# Patient Record
Sex: Male | Born: 1960
Health system: Southern US, Community
[De-identification: ages and names within clinical notes are randomized; demographics above are authoritative.]

## PROBLEM LIST (undated history)

## (undated) DIAGNOSIS — J449 Chronic obstructive pulmonary disease, unspecified: Secondary | ICD-10-CM

## (undated) DIAGNOSIS — G629 Polyneuropathy, unspecified: Secondary | ICD-10-CM

## (undated) DIAGNOSIS — I2510 Atherosclerotic heart disease of native coronary artery without angina pectoris: Secondary | ICD-10-CM

## (undated) DIAGNOSIS — F419 Anxiety disorder, unspecified: Secondary | ICD-10-CM

## (undated) DIAGNOSIS — J189 Pneumonia, unspecified organism: Secondary | ICD-10-CM

## (undated) DIAGNOSIS — K5900 Constipation, unspecified: Secondary | ICD-10-CM

## (undated) DIAGNOSIS — R0600 Dyspnea, unspecified: Secondary | ICD-10-CM

## (undated) DIAGNOSIS — E119 Type 2 diabetes mellitus without complications: Secondary | ICD-10-CM

## (undated) HISTORY — PX: VASECTOMY: SHX75

## (undated) HISTORY — PX: KNEE ARTHROSCOPY: SUR90

## (undated) HISTORY — PX: CIRCUMCISION: SUR203

## (undated) HISTORY — PX: COLONOSCOPY W/ POLYPECTOMY: SHX1380

---

## 2005-01-04 ENCOUNTER — Ambulatory Visit: Payer: Self-pay | Admitting: Family Medicine

## 2005-01-07 ENCOUNTER — Ambulatory Visit: Payer: Self-pay | Admitting: Family Medicine

## 2005-02-01 ENCOUNTER — Ambulatory Visit: Payer: Self-pay | Admitting: Family Medicine

## 2005-04-08 ENCOUNTER — Ambulatory Visit: Payer: Self-pay | Admitting: Family Medicine

## 2012-01-20 ENCOUNTER — Other Ambulatory Visit: Payer: Self-pay | Admitting: Neurosurgery

## 2012-01-20 DIAGNOSIS — M542 Cervicalgia: Secondary | ICD-10-CM

## 2012-01-27 ENCOUNTER — Ambulatory Visit
Admission: RE | Admit: 2012-01-27 | Discharge: 2012-01-27 | Disposition: A | Discharge status: Home / Self Care | Payer: BC Managed Care – PPO | Source: Ambulatory Visit | Attending: Neurosurgery | Admitting: Neurosurgery

## 2012-01-27 VITALS — BP 133/72 | HR 63 | Ht 74.0 in | Wt 261.0 lb

## 2012-01-27 DIAGNOSIS — M542 Cervicalgia: Secondary | ICD-10-CM

## 2012-01-27 MED ORDER — ONDANSETRON HCL 4 MG/2ML IJ SOLN
4.0000 mg | Freq: Once | INTRAMUSCULAR | Status: AC
  Administered 2012-01-27: 4 mg via INTRAMUSCULAR

## 2012-01-27 MED ORDER — IOHEXOL 300 MG/ML  SOLN
10.0000 mL | Freq: Once | INTRAMUSCULAR | Status: AC | PRN
  Administered 2012-01-27: 10 mL via INTRATHECAL

## 2012-01-27 MED ORDER — MEPERIDINE HCL 100 MG/ML IJ SOLN
100.0000 mg | Freq: Once | INTRAMUSCULAR | Status: AC
  Administered 2012-01-27: 100 mg via INTRAMUSCULAR

## 2012-01-27 MED ORDER — DIAZEPAM 5 MG PO TABS
10.0000 mg | ORAL_TABLET | Freq: Once | ORAL | Status: AC
  Administered 2012-01-27: 10 mg via ORAL

## 2012-01-27 MED ORDER — HYDROCODONE-ACETAMINOPHEN 5-325 MG PO TABS
1.0000 | ORAL_TABLET | Freq: Once | ORAL | Status: AC
  Administered 2012-01-27: 1 via ORAL

## 2014-04-18 ENCOUNTER — Other Ambulatory Visit: Payer: Self-pay | Admitting: Neurosurgery

## 2014-04-18 DIAGNOSIS — M47812 Spondylosis without myelopathy or radiculopathy, cervical region: Secondary | ICD-10-CM

## 2014-04-18 DIAGNOSIS — M545 Low back pain: Secondary | ICD-10-CM

## 2014-05-06 ENCOUNTER — Ambulatory Visit
Admission: RE | Admit: 2014-05-06 | Discharge: 2014-05-06 | Disposition: A | Discharge status: Home / Self Care | Payer: BC Managed Care – PPO | Source: Ambulatory Visit | Attending: Neurosurgery | Admitting: Neurosurgery

## 2014-05-06 DIAGNOSIS — M47812 Spondylosis without myelopathy or radiculopathy, cervical region: Secondary | ICD-10-CM

## 2014-05-06 DIAGNOSIS — M545 Low back pain: Secondary | ICD-10-CM

## 2014-07-16 ENCOUNTER — Other Ambulatory Visit: Payer: Self-pay | Admitting: Neurosurgery

## 2014-07-16 DIAGNOSIS — M4722 Other spondylosis with radiculopathy, cervical region: Secondary | ICD-10-CM

## 2014-07-16 DIAGNOSIS — M4712 Other spondylosis with myelopathy, cervical region: Secondary | ICD-10-CM

## 2014-07-16 DIAGNOSIS — G8929 Other chronic pain: Secondary | ICD-10-CM

## 2014-07-16 DIAGNOSIS — M545 Low back pain: Principal | ICD-10-CM

## 2015-09-15 HISTORY — PX: CARDIAC CATHETERIZATION: SHX172

## 2015-09-24 DIAGNOSIS — I249 Acute ischemic heart disease, unspecified: Secondary | ICD-10-CM | POA: Diagnosis not present

## 2015-09-24 DIAGNOSIS — I10 Essential (primary) hypertension: Secondary | ICD-10-CM | POA: Diagnosis not present

## 2015-09-24 DIAGNOSIS — G4733 Obstructive sleep apnea (adult) (pediatric): Secondary | ICD-10-CM

## 2015-09-24 DIAGNOSIS — E669 Obesity, unspecified: Secondary | ICD-10-CM | POA: Diagnosis not present

## 2015-09-24 DIAGNOSIS — E785 Hyperlipidemia, unspecified: Secondary | ICD-10-CM | POA: Diagnosis not present

## 2015-09-24 DIAGNOSIS — J449 Chronic obstructive pulmonary disease, unspecified: Secondary | ICD-10-CM

## 2015-09-25 DIAGNOSIS — E669 Obesity, unspecified: Secondary | ICD-10-CM | POA: Diagnosis not present

## 2015-09-25 DIAGNOSIS — E785 Hyperlipidemia, unspecified: Secondary | ICD-10-CM | POA: Diagnosis not present

## 2015-09-25 DIAGNOSIS — I10 Essential (primary) hypertension: Secondary | ICD-10-CM | POA: Diagnosis not present

## 2015-09-25 DIAGNOSIS — I249 Acute ischemic heart disease, unspecified: Secondary | ICD-10-CM | POA: Diagnosis not present

## 2015-09-26 DIAGNOSIS — I209 Angina pectoris, unspecified: Secondary | ICD-10-CM | POA: Insufficient documentation

## 2015-10-14 DIAGNOSIS — F17200 Nicotine dependence, unspecified, uncomplicated: Secondary | ICD-10-CM | POA: Insufficient documentation

## 2015-10-14 DIAGNOSIS — I10 Essential (primary) hypertension: Secondary | ICD-10-CM | POA: Insufficient documentation

## 2015-10-14 DIAGNOSIS — E785 Hyperlipidemia, unspecified: Secondary | ICD-10-CM | POA: Insufficient documentation

## 2015-10-14 DIAGNOSIS — I2510 Atherosclerotic heart disease of native coronary artery without angina pectoris: Secondary | ICD-10-CM | POA: Insufficient documentation

## 2016-07-28 DIAGNOSIS — E1165 Type 2 diabetes mellitus with hyperglycemia: Secondary | ICD-10-CM

## 2016-07-28 DIAGNOSIS — R509 Fever, unspecified: Secondary | ICD-10-CM | POA: Diagnosis not present

## 2016-07-28 DIAGNOSIS — N12 Tubulo-interstitial nephritis, not specified as acute or chronic: Secondary | ICD-10-CM

## 2016-07-28 DIAGNOSIS — B9620 Unspecified Escherichia coli [E. coli] as the cause of diseases classified elsewhere: Secondary | ICD-10-CM

## 2016-07-28 DIAGNOSIS — I2510 Atherosclerotic heart disease of native coronary artery without angina pectoris: Secondary | ICD-10-CM | POA: Diagnosis not present

## 2016-07-29 DIAGNOSIS — R509 Fever, unspecified: Secondary | ICD-10-CM | POA: Diagnosis not present

## 2016-07-29 DIAGNOSIS — N12 Tubulo-interstitial nephritis, not specified as acute or chronic: Secondary | ICD-10-CM | POA: Diagnosis not present

## 2016-07-29 DIAGNOSIS — E1165 Type 2 diabetes mellitus with hyperglycemia: Secondary | ICD-10-CM | POA: Diagnosis not present

## 2016-07-29 DIAGNOSIS — B9620 Unspecified Escherichia coli [E. coli] as the cause of diseases classified elsewhere: Secondary | ICD-10-CM | POA: Diagnosis not present

## 2016-07-31 DIAGNOSIS — B9620 Unspecified Escherichia coli [E. coli] as the cause of diseases classified elsewhere: Secondary | ICD-10-CM | POA: Diagnosis not present

## 2016-07-31 DIAGNOSIS — E1165 Type 2 diabetes mellitus with hyperglycemia: Secondary | ICD-10-CM | POA: Diagnosis not present

## 2016-07-31 DIAGNOSIS — N12 Tubulo-interstitial nephritis, not specified as acute or chronic: Secondary | ICD-10-CM | POA: Diagnosis not present

## 2016-07-31 DIAGNOSIS — R509 Fever, unspecified: Secondary | ICD-10-CM | POA: Diagnosis not present

## 2017-05-31 DIAGNOSIS — D510 Vitamin B12 deficiency anemia due to intrinsic factor deficiency: Secondary | ICD-10-CM | POA: Diagnosis not present

## 2017-06-13 DIAGNOSIS — M48061 Spinal stenosis, lumbar region without neurogenic claudication: Secondary | ICD-10-CM | POA: Diagnosis not present

## 2017-06-28 DIAGNOSIS — D510 Vitamin B12 deficiency anemia due to intrinsic factor deficiency: Secondary | ICD-10-CM | POA: Diagnosis not present

## 2017-06-29 DIAGNOSIS — Z6835 Body mass index (BMI) 35.0-35.9, adult: Secondary | ICD-10-CM | POA: Diagnosis not present

## 2017-06-29 DIAGNOSIS — J329 Chronic sinusitis, unspecified: Secondary | ICD-10-CM | POA: Diagnosis not present

## 2017-06-29 DIAGNOSIS — J302 Other seasonal allergic rhinitis: Secondary | ICD-10-CM | POA: Diagnosis not present

## 2017-06-29 DIAGNOSIS — J40 Bronchitis, not specified as acute or chronic: Secondary | ICD-10-CM | POA: Diagnosis not present

## 2017-07-20 DIAGNOSIS — M48061 Spinal stenosis, lumbar region without neurogenic claudication: Secondary | ICD-10-CM | POA: Diagnosis not present

## 2017-07-20 DIAGNOSIS — M4802 Spinal stenosis, cervical region: Secondary | ICD-10-CM | POA: Diagnosis not present

## 2017-07-20 DIAGNOSIS — M545 Low back pain: Secondary | ICD-10-CM | POA: Diagnosis not present

## 2017-08-24 DIAGNOSIS — D510 Vitamin B12 deficiency anemia due to intrinsic factor deficiency: Secondary | ICD-10-CM | POA: Diagnosis not present

## 2017-08-28 DIAGNOSIS — M5137 Other intervertebral disc degeneration, lumbosacral region: Secondary | ICD-10-CM | POA: Diagnosis not present

## 2017-08-28 DIAGNOSIS — E1165 Type 2 diabetes mellitus with hyperglycemia: Secondary | ICD-10-CM | POA: Diagnosis not present

## 2017-08-28 DIAGNOSIS — G894 Chronic pain syndrome: Secondary | ICD-10-CM | POA: Diagnosis not present

## 2017-08-28 DIAGNOSIS — M170 Bilateral primary osteoarthritis of knee: Secondary | ICD-10-CM | POA: Diagnosis not present

## 2017-09-13 DIAGNOSIS — G4733 Obstructive sleep apnea (adult) (pediatric): Secondary | ICD-10-CM | POA: Diagnosis not present

## 2017-09-13 DIAGNOSIS — J45901 Unspecified asthma with (acute) exacerbation: Secondary | ICD-10-CM | POA: Diagnosis not present

## 2017-10-05 DIAGNOSIS — Z87891 Personal history of nicotine dependence: Secondary | ICD-10-CM | POA: Diagnosis not present

## 2017-10-17 DIAGNOSIS — I2510 Atherosclerotic heart disease of native coronary artery without angina pectoris: Secondary | ICD-10-CM | POA: Diagnosis not present

## 2017-10-17 DIAGNOSIS — I10 Essential (primary) hypertension: Secondary | ICD-10-CM | POA: Diagnosis not present

## 2017-10-17 DIAGNOSIS — F17200 Nicotine dependence, unspecified, uncomplicated: Secondary | ICD-10-CM | POA: Diagnosis not present

## 2017-10-17 DIAGNOSIS — Z955 Presence of coronary angioplasty implant and graft: Secondary | ICD-10-CM | POA: Diagnosis not present

## 2017-10-17 DIAGNOSIS — E119 Type 2 diabetes mellitus without complications: Secondary | ICD-10-CM | POA: Diagnosis not present

## 2017-10-19 DIAGNOSIS — M48061 Spinal stenosis, lumbar region without neurogenic claudication: Secondary | ICD-10-CM | POA: Diagnosis not present

## 2017-10-19 DIAGNOSIS — M4802 Spinal stenosis, cervical region: Secondary | ICD-10-CM | POA: Diagnosis not present

## 2017-10-19 DIAGNOSIS — I10 Essential (primary) hypertension: Secondary | ICD-10-CM | POA: Diagnosis not present

## 2017-10-19 DIAGNOSIS — Z6837 Body mass index (BMI) 37.0-37.9, adult: Secondary | ICD-10-CM | POA: Diagnosis not present

## 2017-10-23 DIAGNOSIS — R918 Other nonspecific abnormal finding of lung field: Secondary | ICD-10-CM | POA: Diagnosis not present

## 2017-10-23 DIAGNOSIS — R911 Solitary pulmonary nodule: Secondary | ICD-10-CM | POA: Diagnosis not present

## 2017-10-23 DIAGNOSIS — G4733 Obstructive sleep apnea (adult) (pediatric): Secondary | ICD-10-CM | POA: Diagnosis not present

## 2017-10-26 DIAGNOSIS — M4802 Spinal stenosis, cervical region: Secondary | ICD-10-CM | POA: Diagnosis not present

## 2017-11-01 DIAGNOSIS — I10 Essential (primary) hypertension: Secondary | ICD-10-CM | POA: Diagnosis not present

## 2017-11-01 DIAGNOSIS — M545 Low back pain: Secondary | ICD-10-CM | POA: Diagnosis not present

## 2017-11-01 DIAGNOSIS — T63481A Toxic effect of venom of other arthropod, accidental (unintentional), initial encounter: Secondary | ICD-10-CM | POA: Diagnosis not present

## 2017-11-01 DIAGNOSIS — Z955 Presence of coronary angioplasty implant and graft: Secondary | ICD-10-CM | POA: Diagnosis not present

## 2017-11-01 DIAGNOSIS — R531 Weakness: Secondary | ICD-10-CM | POA: Diagnosis not present

## 2017-11-01 DIAGNOSIS — G8929 Other chronic pain: Secondary | ICD-10-CM | POA: Diagnosis not present

## 2017-11-01 DIAGNOSIS — I2510 Atherosclerotic heart disease of native coronary artery without angina pectoris: Secondary | ICD-10-CM | POA: Diagnosis not present

## 2017-11-01 DIAGNOSIS — E538 Deficiency of other specified B group vitamins: Secondary | ICD-10-CM | POA: Diagnosis not present

## 2017-11-25 DIAGNOSIS — L259 Unspecified contact dermatitis, unspecified cause: Secondary | ICD-10-CM | POA: Diagnosis not present

## 2017-11-25 DIAGNOSIS — J209 Acute bronchitis, unspecified: Secondary | ICD-10-CM | POA: Diagnosis not present

## 2017-11-27 DIAGNOSIS — Z6836 Body mass index (BMI) 36.0-36.9, adult: Secondary | ICD-10-CM | POA: Diagnosis not present

## 2017-11-27 DIAGNOSIS — M4802 Spinal stenosis, cervical region: Secondary | ICD-10-CM | POA: Diagnosis not present

## 2017-11-27 DIAGNOSIS — M48061 Spinal stenosis, lumbar region without neurogenic claudication: Secondary | ICD-10-CM | POA: Diagnosis not present

## 2017-11-27 DIAGNOSIS — I10 Essential (primary) hypertension: Secondary | ICD-10-CM | POA: Diagnosis not present

## 2017-12-04 ENCOUNTER — Other Ambulatory Visit: Payer: Self-pay | Admitting: Neurosurgery

## 2017-12-04 DIAGNOSIS — M4802 Spinal stenosis, cervical region: Secondary | ICD-10-CM

## 2017-12-04 DIAGNOSIS — M48061 Spinal stenosis, lumbar region without neurogenic claudication: Secondary | ICD-10-CM

## 2017-12-18 ENCOUNTER — Ambulatory Visit
Admission: RE | Admit: 2017-12-18 | Discharge: 2017-12-18 | Disposition: A | Discharge status: Home / Self Care | Payer: Medicare Other | Source: Ambulatory Visit | Attending: Neurosurgery | Admitting: Neurosurgery

## 2017-12-18 DIAGNOSIS — M4802 Spinal stenosis, cervical region: Secondary | ICD-10-CM | POA: Diagnosis not present

## 2017-12-18 DIAGNOSIS — M48061 Spinal stenosis, lumbar region without neurogenic claudication: Secondary | ICD-10-CM

## 2017-12-18 MED ORDER — MEPERIDINE HCL 100 MG/ML IJ SOLN
75.0000 mg | Freq: Once | INTRAMUSCULAR | Status: AC
  Administered 2017-12-18: 75 mg via INTRAMUSCULAR

## 2017-12-18 MED ORDER — ONDANSETRON HCL 4 MG/2ML IJ SOLN
4.0000 mg | Freq: Once | INTRAMUSCULAR | Status: AC
  Administered 2017-12-18: 4 mg via INTRAMUSCULAR

## 2017-12-18 MED ORDER — IOPAMIDOL (ISOVUE-M 300) INJECTION 61%
10.0000 mL | Freq: Once | INTRAMUSCULAR | Status: AC | PRN
  Administered 2017-12-18: 10 mL via INTRATHECAL

## 2017-12-18 MED ORDER — DIAZEPAM 5 MG PO TABS
10.0000 mg | ORAL_TABLET | Freq: Once | ORAL | Status: AC
  Administered 2017-12-18: 5 mg via ORAL

## 2017-12-18 NOTE — Discharge Instructions (Signed)

## 2018-01-02 ENCOUNTER — Other Ambulatory Visit (HOSPITAL_COMMUNITY): Payer: Self-pay | Admitting: Neurosurgery

## 2018-01-02 DIAGNOSIS — M4712 Other spondylosis with myelopathy, cervical region: Secondary | ICD-10-CM

## 2018-01-02 DIAGNOSIS — M48062 Spinal stenosis, lumbar region with neurogenic claudication: Secondary | ICD-10-CM | POA: Diagnosis not present

## 2018-01-02 DIAGNOSIS — I10 Essential (primary) hypertension: Secondary | ICD-10-CM | POA: Diagnosis not present

## 2018-01-02 DIAGNOSIS — M4802 Spinal stenosis, cervical region: Secondary | ICD-10-CM | POA: Diagnosis not present

## 2018-01-02 DIAGNOSIS — Z6837 Body mass index (BMI) 37.0-37.9, adult: Secondary | ICD-10-CM | POA: Diagnosis not present

## 2018-01-06 ENCOUNTER — Ambulatory Visit (HOSPITAL_COMMUNITY)
Admission: RE | Admit: 2018-01-06 | Discharge: 2018-01-06 | Disposition: A | Discharge status: Home / Self Care | Payer: Medicare Other | Source: Ambulatory Visit | Attending: Neurosurgery | Admitting: Neurosurgery

## 2018-01-06 DIAGNOSIS — M50223 Other cervical disc displacement at C6-C7 level: Secondary | ICD-10-CM | POA: Diagnosis not present

## 2018-01-06 DIAGNOSIS — M5001 Cervical disc disorder with myelopathy,  high cervical region: Secondary | ICD-10-CM | POA: Insufficient documentation

## 2018-01-06 DIAGNOSIS — M4712 Other spondylosis with myelopathy, cervical region: Secondary | ICD-10-CM | POA: Insufficient documentation

## 2018-01-06 DIAGNOSIS — M4804 Spinal stenosis, thoracic region: Secondary | ICD-10-CM | POA: Diagnosis not present

## 2018-01-06 DIAGNOSIS — M4802 Spinal stenosis, cervical region: Secondary | ICD-10-CM | POA: Diagnosis not present

## 2018-01-09 ENCOUNTER — Other Ambulatory Visit: Payer: Self-pay | Admitting: Neurosurgery

## 2018-01-10 ENCOUNTER — Other Ambulatory Visit: Payer: Self-pay

## 2018-01-10 ENCOUNTER — Other Ambulatory Visit: Payer: Self-pay | Admitting: Neurosurgery

## 2018-01-10 ENCOUNTER — Encounter (HOSPITAL_COMMUNITY): Payer: Self-pay | Admitting: *Deleted

## 2018-01-10 NOTE — Progress Notes (Addendum)
Mr. Nathan Franco denies chest pain, patient does experience shortness of breath from COPD, patient reported. Patient has sleep apnea and uses a CPAP every night, I asked patient to bring mask for CPAP in with in. Mr. Nathan Franco has Type II diabetes, he does not check CBG very often.  I instructed patient to begin checking CBG's and work on keeping it between 70 - 180.  I explained to pain the problems that having a elevated blood sugar can cause.  I instructed patient to not take Januvia the morning of surgery.  I instructed patient to check CBG after awaking and every 2 hours until arrival  to the hospital.  I Instructed patient if CBG is less than 70 to drink 1/2 cup of a clear juice. Recheck CBG in 15 minutes then call pre- op desk at [phone] for further instructions.  Mr Nathan Franco said, "it has never been under 70."  Patient does not rememberwhat his last A1C  Was, "I do not see a diabetic specialist."  [facility], she stated that last A1C was drawn 08/2017 and was 7.0.  I called Nikki at Dr Lovell SheehanJenkins office and left voice message if that she have Dr Lovell SheehanJenkins to sign orders and if they have any records from PCP to fax them to us.

## 2018-01-10 NOTE — Anesthesia Preprocedure Evaluation (Addendum)
Anesthesia Evaluation  Patient identified by MRN, date of birth, ID band Patient awake    Reviewed: Allergy & Precautions, NPO status , Patient's Chart, lab work & pertinent test results, reviewed documented beta blocker date and time   Airway Mallampati: III  TM Distance: >3 FB Neck ROM: Limited    Dental  (+) Teeth Intact, Dental Advisory Given, Implants,    Pulmonary asthma , sleep apnea and Continuous Positive Airway Pressure Ventilation , COPD,  COPD inhaler, former smoker,    Pulmonary exam normal breath sounds clear to auscultation       Cardiovascular hypertension, Pt. on medications and Pt. on home beta blockers + angina + CAD and + Cardiac Stents  (-) Past MI and (-) CABG Normal cardiovascular exam Rhythm:Regular Rate:Normal     Neuro/Psych PSYCHIATRIC DISORDERS Anxiety negative neurological ROS     GI/Hepatic negative GI ROS, Neg liver ROS,   Endo/Other  diabetes, Type 2, Oral Hypoglycemic AgentsObesity   Renal/GU negative Renal ROS     Musculoskeletal negative musculoskeletal ROS (+)   Abdominal   Peds  Hematology negative hematology ROS (+)   Anesthesia Other Findings Day of surgery medications reviewed with the patient.  Reproductive/Obstetrics                            Anesthesia Physical Anesthesia Plan  ASA: III  Anesthesia Plan: General   Post-op Pain Management:    Induction: Intravenous  PONV Risk Score and Plan: 3 and Midazolam, Dexamethasone and Ondansetron  Airway Management Planned: Oral ETT and Video Laryngoscope Planned  Additional Equipment:   Intra-op Plan:   Post-operative Plan: Extubation in OR  Informed Consent: I have reviewed the patients History and Physical, chart, labs and discussed the procedure including the risks, benefits and alternatives for the proposed anesthesia with the patient or authorized representative who has indicated his/her  understanding and acceptance.   Dental advisory given  Plan Discussed with: CRNA and Anesthesiologist  Anesthesia Plan Comments: (Stent to LAD in 2017, [facility] Dr. Bary CastillaKalil. Cleared pt for surgery 01/09/2018. TTE 10/17/2017 showed moderate concentric LVH. Normal left ventricular size and systolic function with no appreciablesegmental abnormality. EF visually estimated at 60-65%. Mild aortic sclerosis noted, with no evidence of stenosis.Mild-to-moderate aortic regurgitation.Trace mitral regurgitation.  EKG on pt chart 04/28/2017: Sinus bradycardia. Rate 58. Possible LVH. Nonspecific T wave changes.)      Anesthesia Quick Evaluation

## 2018-01-15 ENCOUNTER — Encounter (HOSPITAL_COMMUNITY): Payer: Self-pay | Admitting: Certified Registered Nurse Anesthetist

## 2018-01-15 ENCOUNTER — Observation Stay (HOSPITAL_COMMUNITY)
Admission: RE | Admit: 2018-01-15 | Discharge: 2018-01-16 | Disposition: A | Discharge status: Home / Self Care | Payer: Medicare Other | Source: Ambulatory Visit | Attending: Neurosurgery | Admitting: Neurosurgery

## 2018-01-15 ENCOUNTER — Encounter (HOSPITAL_COMMUNITY)
Admission: RE | Disposition: A | Discharge status: Home / Self Care | Payer: Self-pay | Source: Ambulatory Visit | Attending: Neurosurgery

## 2018-01-15 ENCOUNTER — Ambulatory Visit (HOSPITAL_COMMUNITY): Payer: Medicare Other

## 2018-01-15 ENCOUNTER — Ambulatory Visit (HOSPITAL_COMMUNITY): Payer: Medicare Other | Admitting: Physician Assistant

## 2018-01-15 DIAGNOSIS — M5001 Cervical disc disorder with myelopathy,  high cervical region: Principal | ICD-10-CM | POA: Insufficient documentation

## 2018-01-15 DIAGNOSIS — Z87891 Personal history of nicotine dependence: Secondary | ICD-10-CM | POA: Diagnosis not present

## 2018-01-15 DIAGNOSIS — Z79899 Other long term (current) drug therapy: Secondary | ICD-10-CM | POA: Insufficient documentation

## 2018-01-15 DIAGNOSIS — E1140 Type 2 diabetes mellitus with diabetic neuropathy, unspecified: Secondary | ICD-10-CM | POA: Insufficient documentation

## 2018-01-15 DIAGNOSIS — M4802 Spinal stenosis, cervical region: Secondary | ICD-10-CM | POA: Insufficient documentation

## 2018-01-15 DIAGNOSIS — I2510 Atherosclerotic heart disease of native coronary artery without angina pectoris: Secondary | ICD-10-CM | POA: Insufficient documentation

## 2018-01-15 DIAGNOSIS — F419 Anxiety disorder, unspecified: Secondary | ICD-10-CM | POA: Diagnosis not present

## 2018-01-15 DIAGNOSIS — Z7984 Long term (current) use of oral hypoglycemic drugs: Secondary | ICD-10-CM | POA: Diagnosis not present

## 2018-01-15 DIAGNOSIS — Z7982 Long term (current) use of aspirin: Secondary | ICD-10-CM | POA: Diagnosis not present

## 2018-01-15 DIAGNOSIS — I10 Essential (primary) hypertension: Secondary | ICD-10-CM | POA: Diagnosis not present

## 2018-01-15 DIAGNOSIS — J449 Chronic obstructive pulmonary disease, unspecified: Secondary | ICD-10-CM | POA: Diagnosis not present

## 2018-01-15 DIAGNOSIS — Z419 Encounter for procedure for purposes other than remedying health state, unspecified: Secondary | ICD-10-CM

## 2018-01-15 DIAGNOSIS — M4722 Other spondylosis with radiculopathy, cervical region: Secondary | ICD-10-CM | POA: Insufficient documentation

## 2018-01-15 DIAGNOSIS — M4712 Other spondylosis with myelopathy, cervical region: Secondary | ICD-10-CM | POA: Diagnosis not present

## 2018-01-15 DIAGNOSIS — Z981 Arthrodesis status: Secondary | ICD-10-CM | POA: Diagnosis not present

## 2018-01-15 DIAGNOSIS — G959 Disease of spinal cord, unspecified: Secondary | ICD-10-CM | POA: Diagnosis present

## 2018-01-15 HISTORY — DX: Dyspnea, unspecified: R06.00

## 2018-01-15 HISTORY — PX: ANTERIOR CERVICAL DECOMP/DISCECTOMY FUSION: SHX1161

## 2018-01-15 HISTORY — DX: Type 2 diabetes mellitus without complications: E11.9

## 2018-01-15 HISTORY — DX: Pneumonia, unspecified organism: J18.9

## 2018-01-15 HISTORY — DX: Chronic obstructive pulmonary disease, unspecified: J44.9

## 2018-01-15 HISTORY — DX: Polyneuropathy, unspecified: G62.9

## 2018-01-15 HISTORY — DX: Constipation, unspecified: K59.00

## 2018-01-15 HISTORY — DX: Atherosclerotic heart disease of native coronary artery without angina pectoris: I25.10

## 2018-01-15 HISTORY — DX: Anxiety disorder, unspecified: F41.9

## 2018-01-15 LAB — CBC
HCT: 43.8 % (ref 39.0–52.0)
Hemoglobin: 13.5 g/dL (ref 13.0–17.0)
MCH: 28.5 pg (ref 26.0–34.0)
MCHC: 30.8 g/dL (ref 30.0–36.0)
MCV: 92.6 fL (ref 80.0–100.0)
Platelets: 219 10*3/uL (ref 150–400)
RBC: 4.73 MIL/uL (ref 4.22–5.81)
RDW: 13 % (ref 11.5–15.5)
WBC: 7.8 10*3/uL (ref 4.0–10.5)
nRBC: 0 % (ref 0.0–0.2)

## 2018-01-15 LAB — BASIC METABOLIC PANEL
Anion gap: 11 (ref 5–15)
BUN: 19 mg/dL (ref 6–20)
CO2: 22 mmol/L (ref 22–32)
Calcium: 9.4 mg/dL (ref 8.9–10.3)
Chloride: 103 mmol/L (ref 98–111)
Creatinine, Ser: 1.45 mg/dL — ABNORMAL HIGH (ref 0.61–1.24)
GFR calc Af Amer: >60 mL/min (ref >60)
GFR calc non Af Amer: 53 mL/min — ABNORMAL LOW (ref >60)
Glucose, Bld: 137 mg/dL — ABNORMAL HIGH (ref 70–99)
Potassium: 3.7 mmol/L (ref 3.5–5.1)
Sodium: 136 mmol/L (ref 135–145)

## 2018-01-15 LAB — TYPE AND SCREEN
ABO/RH(D): A POS
Antibody Screen: NEGATIVE

## 2018-01-15 LAB — ABO/RH: ABO/RH(D): A POS

## 2018-01-15 LAB — HEMOGLOBIN A1C
Hgb A1c MFr Bld: 6.8 % — ABNORMAL HIGH (ref 4.8–5.6)
MEAN PLASMA GLUCOSE: 148.46 mg/dL

## 2018-01-15 LAB — GLUCOSE, CAPILLARY
GLUCOSE-CAPILLARY: 137 mg/dL — AB (ref 70–99)
Glucose-Capillary: 126 mg/dL — ABNORMAL HIGH (ref 70–99)
Glucose-Capillary: 153 mg/dL — ABNORMAL HIGH (ref 70–99)

## 2018-01-15 SURGERY — ANTERIOR CERVICAL DECOMPRESSION/DISCECTOMY FUSION 1 LEVEL
Anesthesia: General | Site: Spine Cervical

## 2018-01-15 MED ORDER — ACETAMINOPHEN 325 MG PO TABS: 650.0000 mg | ORAL_TABLET | ORAL | Status: DC | PRN

## 2018-01-15 MED ORDER — LUBIPROSTONE 24 MCG PO CAPS
24.0000 ug | ORAL_CAPSULE | Freq: Three times a day (TID) | ORAL | Status: DC
  Filled 2018-01-15 (×3): qty 1

## 2018-01-15 MED ORDER — ROSUVASTATIN CALCIUM 20 MG PO TABS
20.0000 mg | ORAL_TABLET | Freq: Every day | ORAL | Status: DC
  Administered 2018-01-15: 20 mg via ORAL
  Filled 2018-01-15 (×2): qty 1

## 2018-01-15 MED ORDER — CYCLOBENZAPRINE HCL 10 MG PO TABS
10.0000 mg | ORAL_TABLET | Freq: Three times a day (TID) | ORAL | Status: DC | PRN
  Administered 2018-01-15 – 2018-01-16 (×2): 10 mg via ORAL
  Filled 2018-01-15: qty 1

## 2018-01-15 MED ORDER — ONDANSETRON HCL 4 MG/2ML IJ SOLN: 4.0000 mg | Freq: Four times a day (QID) | INTRAMUSCULAR | Status: DC | PRN

## 2018-01-15 MED ORDER — LINAGLIPTIN 5 MG PO TABS
5.0000 mg | ORAL_TABLET | Freq: Every day | ORAL | Status: DC
  Filled 2018-01-15: qty 1

## 2018-01-15 MED ORDER — MIDAZOLAM HCL 2 MG/2ML IJ SOLN
INTRAMUSCULAR | Status: AC
  Filled 2018-01-15: qty 2

## 2018-01-15 MED ORDER — PROPOFOL 10 MG/ML IV BOLUS
INTRAVENOUS | Status: DC | PRN
  Administered 2018-01-15: 140 mg via INTRAVENOUS

## 2018-01-15 MED ORDER — PHENYLEPHRINE HCL 10 MG/ML IJ SOLN
INTRAMUSCULAR | Status: AC
  Filled 2018-01-15: qty 1

## 2018-01-15 MED ORDER — FENTANYL CITRATE (PF) 100 MCG/2ML IJ SOLN
INTRAMUSCULAR | Status: AC
  Filled 2018-01-15: qty 2

## 2018-01-15 MED ORDER — ACETAMINOPHEN 650 MG RE SUPP
650.0000 mg | RECTAL | Status: DC | PRN
  Filled 2018-01-15: qty 1

## 2018-01-15 MED ORDER — ALBUTEROL SULFATE (2.5 MG/3ML) 0.083% IN NEBU
2.5000 mg | INHALATION_SOLUTION | Freq: Four times a day (QID) | RESPIRATORY_TRACT | Status: DC | PRN
  Administered 2018-01-15: 2.5 mg via RESPIRATORY_TRACT
  Filled 2018-01-15: qty 3

## 2018-01-15 MED ORDER — OLMESARTAN MEDOXOMIL-HCTZ 40-25 MG PO TABS: 1.0000 | ORAL_TABLET | Freq: Every day | ORAL | Status: DC

## 2018-01-15 MED ORDER — THROMBIN 5000 UNITS EX SOLR
CUTANEOUS | Status: AC
  Filled 2018-01-15: qty 5000

## 2018-01-15 MED ORDER — PANTOPRAZOLE SODIUM 40 MG PO TBEC
40.0000 mg | DELAYED_RELEASE_TABLET | Freq: Every day | ORAL | Status: DC
  Administered 2018-01-15: 40 mg via ORAL
  Filled 2018-01-15: qty 1

## 2018-01-15 MED ORDER — LIDOCAINE 2% (20 MG/ML) 5 ML SYRINGE
INTRAMUSCULAR | Status: DC | PRN
  Administered 2018-01-15: 80 mg via INTRAVENOUS

## 2018-01-15 MED ORDER — SODIUM CHLORIDE 0.9 % IV SOLN
INTRAVENOUS | Status: DC | PRN
  Administered 2018-01-15: 50 ug/min via INTRAVENOUS
  Administered 2018-01-15: 25 ug/min via INTRAVENOUS

## 2018-01-15 MED ORDER — ROCURONIUM BROMIDE 50 MG/5ML IV SOSY
PREFILLED_SYRINGE | INTRAVENOUS | Status: AC
  Filled 2018-01-15: qty 10

## 2018-01-15 MED ORDER — LACTATED RINGERS IV SOLN
INTRAVENOUS | Status: DC
  Administered 2018-01-15 (×2): via INTRAVENOUS

## 2018-01-15 MED ORDER — PROMETHAZINE HCL 25 MG/ML IJ SOLN: 6.2500 mg | INTRAMUSCULAR | Status: DC | PRN

## 2018-01-15 MED ORDER — HYDROCHLOROTHIAZIDE 25 MG PO TABS
25.0000 mg | ORAL_TABLET | Freq: Every day | ORAL | Status: DC
  Administered 2018-01-15: 25 mg via ORAL
  Filled 2018-01-15 (×2): qty 1

## 2018-01-15 MED ORDER — PANTOPRAZOLE SODIUM 40 MG IV SOLR: 40.0000 mg | Freq: Every day | INTRAVENOUS | Status: DC

## 2018-01-15 MED ORDER — THROMBIN 5000 UNITS EX SOLR
OROMUCOSAL | Status: DC | PRN
  Administered 2018-01-15: 5 mL via TOPICAL

## 2018-01-15 MED ORDER — DEXAMETHASONE SODIUM PHOSPHATE 10 MG/ML IJ SOLN
INTRAMUSCULAR | Status: DC | PRN
  Administered 2018-01-15: 5 mg via INTRAVENOUS

## 2018-01-15 MED ORDER — 0.9 % SODIUM CHLORIDE (POUR BTL) OPTIME
TOPICAL | Status: DC | PRN
  Administered 2018-01-15: 1000 mL

## 2018-01-15 MED ORDER — ONDANSETRON HCL 4 MG PO TABS: 4.0000 mg | ORAL_TABLET | Freq: Four times a day (QID) | ORAL | Status: DC | PRN

## 2018-01-15 MED ORDER — TOPIRAMATE 25 MG PO TABS
50.0000 mg | ORAL_TABLET | Freq: Two times a day (BID) | ORAL | Status: DC
  Administered 2018-01-15: 50 mg via ORAL
  Filled 2018-01-15 (×2): qty 2

## 2018-01-15 MED ORDER — DEXAMETHASONE SODIUM PHOSPHATE 10 MG/ML IJ SOLN
INTRAMUSCULAR | Status: AC
  Filled 2018-01-15: qty 1

## 2018-01-15 MED ORDER — DEXAMETHASONE SODIUM PHOSPHATE 4 MG/ML IJ SOLN: 2.0000 mg | Freq: Four times a day (QID) | INTRAMUSCULAR | Status: AC

## 2018-01-15 MED ORDER — LIDOCAINE-EPINEPHRINE 1 %-1:100000 IJ SOLN
INTRAMUSCULAR | Status: AC
  Filled 2018-01-15: qty 1

## 2018-01-15 MED ORDER — GABAPENTIN 400 MG PO CAPS
400.0000 mg | ORAL_CAPSULE | Freq: Four times a day (QID) | ORAL | Status: DC
  Administered 2018-01-15 (×2): 400 mg via ORAL
  Filled 2018-01-15 (×3): qty 1

## 2018-01-15 MED ORDER — DOCUSATE SODIUM 100 MG PO CAPS
100.0000 mg | ORAL_CAPSULE | Freq: Two times a day (BID) | ORAL | Status: DC
  Administered 2018-01-15: 100 mg via ORAL
  Filled 2018-01-15 (×2): qty 1

## 2018-01-15 MED ORDER — LIDOCAINE 2% (20 MG/ML) 5 ML SYRINGE
INTRAMUSCULAR | Status: AC
  Filled 2018-01-15: qty 5

## 2018-01-15 MED ORDER — SODIUM CHLORIDE 0.9 % IV SOLN
INTRAVENOUS | Status: DC | PRN
  Administered 2018-01-15: 500 mL

## 2018-01-15 MED ORDER — CHLORHEXIDINE GLUCONATE CLOTH 2 % EX PADS: 6.0000 | MEDICATED_PAD | Freq: Once | CUTANEOUS | Status: DC

## 2018-01-15 MED ORDER — CEFAZOLIN SODIUM-DEXTROSE 2-4 GM/100ML-% IV SOLN
2.0000 g | INTRAVENOUS | Status: AC
  Administered 2018-01-15: 2 g via INTRAVENOUS

## 2018-01-15 MED ORDER — CYCLOBENZAPRINE HCL 10 MG PO TABS
ORAL_TABLET | ORAL | Status: AC
  Filled 2018-01-15: qty 1

## 2018-01-15 MED ORDER — IPRATROPIUM-ALBUTEROL 0.5-2.5 (3) MG/3ML IN SOLN: 3.0000 mL | Freq: Four times a day (QID) | RESPIRATORY_TRACT | Status: DC | PRN

## 2018-01-15 MED ORDER — BACITRACIN ZINC 500 UNIT/GM EX OINT
TOPICAL_OINTMENT | CUTANEOUS | Status: AC
  Filled 2018-01-15: qty 28.35

## 2018-01-15 MED ORDER — BACITRACIN ZINC 500 UNIT/GM EX OINT
TOPICAL_OINTMENT | CUTANEOUS | Status: DC | PRN
  Administered 2018-01-15: 1 via TOPICAL

## 2018-01-15 MED ORDER — HYDROCODONE-ACETAMINOPHEN 5-325 MG PO TABS
ORAL_TABLET | ORAL | Status: AC
  Filled 2018-01-15: qty 1

## 2018-01-15 MED ORDER — HYDROCODONE-ACETAMINOPHEN 10-325 MG PO TABS
ORAL_TABLET | ORAL | Status: AC
  Filled 2018-01-15: qty 1

## 2018-01-15 MED ORDER — PROPOFOL 10 MG/ML IV BOLUS
INTRAVENOUS | Status: AC
  Filled 2018-01-15: qty 20

## 2018-01-15 MED ORDER — FLUTICASONE FUROATE-VILANTEROL 100-25 MCG/INH IN AEPB
1.0000 | INHALATION_SPRAY | Freq: Every day | RESPIRATORY_TRACT | Status: DC
  Administered 2018-01-16: 1 via RESPIRATORY_TRACT
  Filled 2018-01-15: qty 28

## 2018-01-15 MED ORDER — MORPHINE SULFATE (PF) 4 MG/ML IV SOLN: 4.0000 mg | INTRAVENOUS | Status: DC | PRN

## 2018-01-15 MED ORDER — ONDANSETRON HCL 4 MG/2ML IJ SOLN
INTRAMUSCULAR | Status: AC
  Filled 2018-01-15: qty 2

## 2018-01-15 MED ORDER — HYDROCODONE-ACETAMINOPHEN 10-325 MG PO TABS
1.0000 | ORAL_TABLET | ORAL | Status: DC | PRN
  Administered 2018-01-15 (×2): 1 via ORAL
  Filled 2018-01-15: qty 1

## 2018-01-15 MED ORDER — BISACODYL 10 MG RE SUPP: 10.0000 mg | Freq: Every day | RECTAL | Status: DC | PRN

## 2018-01-15 MED ORDER — FENTANYL CITRATE (PF) 100 MCG/2ML IJ SOLN
25.0000 ug | INTRAMUSCULAR | Status: DC | PRN
  Administered 2018-01-15 (×3): 50 ug via INTRAVENOUS

## 2018-01-15 MED ORDER — LIDOCAINE-EPINEPHRINE 1 %-1:100000 IJ SOLN
INTRAMUSCULAR | Status: DC | PRN
  Administered 2018-01-15: 10 mL

## 2018-01-15 MED ORDER — CEFAZOLIN SODIUM 1 G IJ SOLR
INTRAMUSCULAR | Status: AC
  Filled 2018-01-15: qty 20

## 2018-01-15 MED ORDER — SODIUM CHLORIDE (PF) 0.9 % IJ SOLN
INTRAMUSCULAR | Status: AC
  Filled 2018-01-15: qty 10

## 2018-01-15 MED ORDER — MONTELUKAST SODIUM 10 MG PO TABS
10.0000 mg | ORAL_TABLET | Freq: Every day | ORAL | Status: DC
  Administered 2018-01-15: 10 mg via ORAL
  Filled 2018-01-15: qty 1

## 2018-01-15 MED ORDER — OXYCODONE HCL 5 MG PO TABS
10.0000 mg | ORAL_TABLET | ORAL | Status: DC | PRN
  Administered 2018-01-15 – 2018-01-16 (×3): 10 mg via ORAL
  Filled 2018-01-15 (×3): qty 2

## 2018-01-15 MED ORDER — ACETAMINOPHEN 500 MG PO TABS
1000.0000 mg | ORAL_TABLET | Freq: Four times a day (QID) | ORAL | Status: DC
  Administered 2018-01-15 – 2018-01-16 (×3): 1000 mg via ORAL
  Filled 2018-01-15 (×3): qty 2

## 2018-01-15 MED ORDER — CEFAZOLIN SODIUM-DEXTROSE 2-4 GM/100ML-% IV SOLN
2.0000 g | Freq: Three times a day (TID) | INTRAVENOUS | Status: AC
  Administered 2018-01-15 – 2018-01-16 (×2): 2 g via INTRAVENOUS
  Filled 2018-01-15 (×2): qty 100

## 2018-01-15 MED ORDER — HYDROXYZINE HCL 25 MG PO TABS
25.0000 mg | ORAL_TABLET | Freq: Every day | ORAL | Status: DC
  Administered 2018-01-15: 25 mg via ORAL
  Filled 2018-01-15 (×2): qty 1

## 2018-01-15 MED ORDER — PHENYLEPHRINE 40 MCG/ML (10ML) SYRINGE FOR IV PUSH (FOR BLOOD PRESSURE SUPPORT)
PREFILLED_SYRINGE | INTRAVENOUS | Status: DC | PRN
  Administered 2018-01-15 (×2): 80 ug via INTRAVENOUS

## 2018-01-15 MED ORDER — ROCURONIUM BROMIDE 50 MG/5ML IV SOSY
PREFILLED_SYRINGE | INTRAVENOUS | Status: DC | PRN
  Administered 2018-01-15: 30 mg via INTRAVENOUS
  Administered 2018-01-15: 50 mg via INTRAVENOUS

## 2018-01-15 MED ORDER — ALUM & MAG HYDROXIDE-SIMETH 200-200-20 MG/5ML PO SUSP: 30.0000 mL | Freq: Four times a day (QID) | ORAL | Status: DC | PRN

## 2018-01-15 MED ORDER — FENTANYL CITRATE (PF) 250 MCG/5ML IJ SOLN
INTRAMUSCULAR | Status: AC
  Filled 2018-01-15: qty 5

## 2018-01-15 MED ORDER — LACTATED RINGERS IV SOLN: INTRAVENOUS | Status: DC

## 2018-01-15 MED ORDER — AZELASTINE HCL 0.1 % NA SOLN: 1.0000 | Freq: Two times a day (BID) | NASAL | Status: DC | PRN

## 2018-01-15 MED ORDER — OXYCODONE HCL 5 MG PO TABS: 5.0000 mg | ORAL_TABLET | ORAL | Status: DC | PRN

## 2018-01-15 MED ORDER — ALPRAZOLAM 0.5 MG PO TABS
0.5000 mg | ORAL_TABLET | Freq: Three times a day (TID) | ORAL | Status: DC | PRN
  Filled 2018-01-15: qty 1

## 2018-01-15 MED ORDER — PHENOL 1.4 % MT LIQD: 1.0000 | OROMUCOSAL | Status: DC | PRN

## 2018-01-15 MED ORDER — FENTANYL CITRATE (PF) 100 MCG/2ML IJ SOLN
INTRAMUSCULAR | Status: DC | PRN
  Administered 2018-01-15: 100 ug via INTRAVENOUS
  Administered 2018-01-15 (×3): 50 ug via INTRAVENOUS

## 2018-01-15 MED ORDER — MENTHOL 3 MG MT LOZG
1.0000 | LOZENGE | OROMUCOSAL | Status: DC | PRN
  Filled 2018-01-15: qty 9

## 2018-01-15 MED ORDER — DEXAMETHASONE 4 MG PO TABS
2.0000 mg | ORAL_TABLET | Freq: Four times a day (QID) | ORAL | Status: AC
  Administered 2018-01-15 (×2): 2 mg via ORAL
  Filled 2018-01-15 (×2): qty 1

## 2018-01-15 MED ORDER — IRBESARTAN 300 MG PO TABS
300.0000 mg | ORAL_TABLET | Freq: Every day | ORAL | Status: DC
  Filled 2018-01-15 (×2): qty 1

## 2018-01-15 MED ORDER — LABETALOL HCL 200 MG PO TABS
200.0000 mg | ORAL_TABLET | Freq: Two times a day (BID) | ORAL | Status: DC
  Administered 2018-01-15: 200 mg via ORAL
  Filled 2018-01-15 (×2): qty 1

## 2018-01-15 MED ORDER — UMECLIDINIUM BROMIDE 62.5 MCG/INH IN AEPB
1.0000 | INHALATION_SPRAY | Freq: Every day | RESPIRATORY_TRACT | Status: DC
  Administered 2018-01-16: 1 via RESPIRATORY_TRACT
  Filled 2018-01-15: qty 7

## 2018-01-15 MED ORDER — ONDANSETRON HCL 4 MG/2ML IJ SOLN
INTRAMUSCULAR | Status: DC | PRN
  Administered 2018-01-15: 4 mg via INTRAVENOUS

## 2018-01-15 MED ORDER — MIDAZOLAM HCL 5 MG/5ML IJ SOLN
INTRAMUSCULAR | Status: DC | PRN
  Administered 2018-01-15: 2 mg via INTRAVENOUS

## 2018-01-15 MED ORDER — SUGAMMADEX SODIUM 200 MG/2ML IV SOLN
INTRAVENOUS | Status: DC | PRN
  Administered 2018-01-15: 265 mg via INTRAVENOUS

## 2018-01-15 SURGICAL SUPPLY — 68 items
APL SKNCLS STERI-STRIP NONHPOA (GAUZE/BANDAGES/DRESSINGS) ×1
BAG DECANTER FOR FLEXI CONT (MISCELLANEOUS) ×3 IMPLANT
BENZOIN TINCTURE PRP APPL 2/3 (GAUZE/BANDAGES/DRESSINGS) ×3 IMPLANT
BIT DRILL NEURO 2X3.1 SFT TUCH (MISCELLANEOUS) ×1 IMPLANT
BLADE SURG 15 STRL LF DISP TIS (BLADE) ×1 IMPLANT
BLADE SURG 15 STRL SS (BLADE)
BLADE ULTRA TIP 2M (BLADE) ×3 IMPLANT
BUR BARREL STRAIGHT FLUTE 4.0 (BURR) ×3 IMPLANT
BUR MATCHSTICK NEURO 3.0 LAGG (BURR) ×3 IMPLANT
CANISTER SUCT 3000ML PPV (MISCELLANEOUS) ×3 IMPLANT
CARTRIDGE OIL MAESTRO DRILL (MISCELLANEOUS) ×1 IMPLANT
CLOSURE WOUND 1/2 X4 (GAUZE/BANDAGES/DRESSINGS) ×1
COVER MAYO STAND STRL (DRAPES) ×3 IMPLANT
COVER WAND RF STERILE (DRAPES) ×1 IMPLANT
DECANTER SPIKE VIAL GLASS SM (MISCELLANEOUS) ×3 IMPLANT
DEVICE FUSION VISTA 14X14X9MM (Spacer) IMPLANT
DIFFUSER DRILL AIR PNEUMATIC (MISCELLANEOUS) ×3 IMPLANT
DRAPE LAPAROTOMY 100X72 PEDS (DRAPES) ×3 IMPLANT
DRAPE MICROSCOPE LEICA (MISCELLANEOUS) IMPLANT
DRAPE POUCH INSTRU U-SHP 10X18 (DRAPES) ×1 IMPLANT
DRAPE SURG 17X23 STRL (DRAPES) ×8 IMPLANT
DRILL NEURO 2X3.1 SOFT TOUCH (MISCELLANEOUS) ×3
DRSG OPSITE POSTOP 4X6 (GAUZE/BANDAGES/DRESSINGS) ×2 IMPLANT
ELECT BLADE 4.0 EZ CLEAN MEGAD (MISCELLANEOUS) ×3
ELECT REM PT RETURN 9FT ADLT (ELECTROSURGICAL) ×3
ELECTRODE BLDE 4.0 EZ CLN MEGD (MISCELLANEOUS) IMPLANT
ELECTRODE REM PT RTRN 9FT ADLT (ELECTROSURGICAL) ×1 IMPLANT
GAUZE 4X4 16PLY RFD (DISPOSABLE) IMPLANT
GAUZE SPONGE 4X4 12PLY STRL (GAUZE/BANDAGES/DRESSINGS) ×1 IMPLANT
GLOVE BIO SURGEON STRL SZ8 (GLOVE) ×1 IMPLANT
GLOVE BIO SURGEON STRL SZ8.5 (GLOVE) ×1 IMPLANT
GLOVE BIOGEL PI IND STRL 6.5 (GLOVE) IMPLANT
GLOVE BIOGEL PI INDICATOR 6.5 (GLOVE) ×4
GLOVE EXAM NITRILE XL STR (GLOVE) IMPLANT
GLOVE SURG SS PI 6.5 STRL IVOR (GLOVE) ×8 IMPLANT
GLOVE SURG SS PI 7.0 STRL IVOR (GLOVE) ×2 IMPLANT
GLOVE SURG SS PI 8.0 STRL IVOR (GLOVE) ×2 IMPLANT
GLOVE SURG SS PI 8.5 STRL IVOR (GLOVE) ×2
GLOVE SURG SS PI 8.5 STRL STRW (GLOVE) IMPLANT
GOWN STRL REUS W/ TWL LRG LVL3 (GOWN DISPOSABLE) IMPLANT
GOWN STRL REUS W/ TWL XL LVL3 (GOWN DISPOSABLE) ×1 IMPLANT
GOWN STRL REUS W/TWL LRG LVL3 (GOWN DISPOSABLE) ×6
GOWN STRL REUS W/TWL XL LVL3 (GOWN DISPOSABLE) ×3
HEMOSTAT POWDER KIT SURGIFOAM (HEMOSTASIS) ×3 IMPLANT
KIT BASIN OR (CUSTOM PROCEDURE TRAY) ×3 IMPLANT
KIT TURNOVER KIT B (KITS) ×3 IMPLANT
MARKER SKIN DUAL TIP RULER LAB (MISCELLANEOUS) ×3 IMPLANT
NDL SPNL 18GX3.5 QUINCKE PK (NEEDLE) ×1 IMPLANT
NEEDLE HYPO 22GX1.5 SAFETY (NEEDLE) ×3 IMPLANT
NEEDLE SPNL 18GX3.5 QUINCKE PK (NEEDLE) ×6 IMPLANT
NS IRRIG 1000ML POUR BTL (IV SOLUTION) ×3 IMPLANT
OIL CARTRIDGE MAESTRO DRILL (MISCELLANEOUS) ×3
PACK LAMINECTOMY NEURO (CUSTOM PROCEDURE TRAY) ×3 IMPLANT
PIN DISTRACTION 14MM (PIN) ×6 IMPLANT
PLATE ANT CERV XTEND 1 LV 16 (Plate) ×2 IMPLANT
PUTTY DBM 2CC CALC GRAN (Putty) ×2 IMPLANT
RUBBERBAND STERILE (MISCELLANEOUS) IMPLANT
SCREW XTD VAR 4.2 SELF TAP (Screw) ×8 IMPLANT
SPONGE INTESTINAL PEANUT (DISPOSABLE) ×4 IMPLANT
SPONGE SURGIFOAM ABS GEL SZ50 (HEMOSTASIS) IMPLANT
STRIP CLOSURE SKIN 1/2X4 (GAUZE/BANDAGES/DRESSINGS) ×2 IMPLANT
SUT VIC AB 0 CT1 27 (SUTURE) ×3
SUT VIC AB 0 CT1 27XBRD ANTBC (SUTURE) ×1 IMPLANT
SUT VIC AB 3-0 SH 8-18 (SUTURE) ×3 IMPLANT
TOWEL GREEN STERILE (TOWEL DISPOSABLE) ×3 IMPLANT
TOWEL GREEN STERILE FF (TOWEL DISPOSABLE) ×3 IMPLANT
VISTA 14X14X9MM (Spacer) ×3 IMPLANT
WATER STERILE IRR 1000ML POUR (IV SOLUTION) ×3 IMPLANT

## 2018-01-15 NOTE — Anesthesia Postprocedure Evaluation (Signed)
Anesthesia Post Note  Patient: Debbe BalesDavid Robert Guzzo  Procedure(s) Performed: ANTERIOR CERVICAL DECOMPRESSION/DISCECTOMY FUSION, INTERBODY PROSTHESIS, PLATE/SCREWS CERVICAL THREE- CERVICAL FOUR (N/A Spine Cervical)     Patient location during evaluation: PACU Anesthesia Type: General Level of consciousness: awake and alert, awake and oriented Pain management: pain level controlled Vital Signs Assessment: post-procedure vital signs reviewed and stable Respiratory status: spontaneous breathing, nonlabored ventilation and respiratory function stable Cardiovascular status: blood pressure returned to baseline and stable Postop Assessment: no apparent nausea or vomiting Anesthetic complications: no    Last Vitals:  Vitals:   01/15/18 1540 01/15/18 1555  BP: (!) 162/101 (!) 151/88  Pulse: 76 74  Resp: 15 10  Temp:    SpO2: 97% 94%    Last Pain:  Vitals:   01/15/18 1602  TempSrc:   PainSc: 4     LLE Motor Response: Purposeful movement;Responds to commands (01/15/18 1555) LLE Sensation: Full sensation (01/15/18 1555) RLE Motor Response: Purposeful movement;Responds to commands (01/15/18 1555) RLE Sensation: Full sensation (01/15/18 1555)      Cecile HearingStephen Edward Landin Tallon

## 2018-01-15 NOTE — Anesthesia Procedure Notes (Signed)
Procedure Name: Intubation Date/Time: 01/15/2018 1:04 PM Performed by: Mayer CamelBarr, Furman Trentman S, CRNA Pre-anesthesia Checklist: Patient identified, Emergency Drugs available, Suction available and Patient being monitored Patient Re-evaluated:Patient Re-evaluated prior to induction Oxygen Delivery Method: Circle System Utilized Preoxygenation: Pre-oxygenation with 100% oxygen Induction Type: IV induction Ventilation: Mask ventilation without difficulty Laryngoscope Size: Glidescope and 4 Grade View: Grade I Tube type: Oral Tube size: 7.5 mm Number of attempts: 1 Airway Equipment and Method: Stylet and Oral airway Placement Confirmation: ETT inserted through vocal cords under direct vision,  positive ETCO2 and breath sounds checked- equal and bilateral Secured at: 23 cm Tube secured with: Tape Dental Injury: Teeth and Oropharynx as per pre-operative assessment

## 2018-01-15 NOTE — Op Note (Signed)
Brief history: The patient is a 57 year old black male who has had progressive numbness tingling weakness.  He was worked up with a cervical MRI which demonstrated spondylosis, he does have stenosis at C3-4.  I discussed the various treatment options with him.  He has weighed the risks, benefits and alternatives of surgery and decided to proceed with a C3-4 anterior cervical discectomy, fusion and plating.  Preoperative diagnosis: C3-4 herniated disc, spondylosis, stenosis, cervical myelopathy, cervical radiculopathy, cervicalgia  Postoperative diagnosis: The same  Procedure: C3-4 anterior cervical discectomy/decompression; C3-4 interbody arthrodesis with local morcellized autograft bone and  bone graft extender; insertion of interbody prosthesis at C3-4 (Zimmer peek interbody prosthesis); anterior cervical plating from C3-4 with globus titanium plate  Surgeon: Dr. Delma OfficerJeff Sherese Heyward  Asst.: Hildred PriestMegan Bergman nurse practitioner  Anesthesia: Gen. endotracheal  Estimated blood loss: 75 cc  Drains: None  Complications: None  Description of procedure: The patient was brought to the operating room by the anesthesia team. General endotracheal anesthesia was induced. A roll was placed under the patient's shoulders to keep the neck in the neutral position. The patient's anterior cervical region was then prepared with Betadine scrub and Betadine solution. Sterile drapes were applied.  The area to be incised was then injected with Marcaine with epinephrine solution. I then used a scalpel to make a transverse incision in the patient's left anterior neck. I used the Metzenbaum scissors to divide the platysmal muscle and then to dissect medial to the sternocleidomastoid muscle, jugular vein, and carotid artery. I carefully dissected down towards the anterior cervical spine identifying the esophagus and retracting it medially. Then using Kitner swabs to clear soft tissue from the anterior cervical spine. We then  inserted a bent spinal needle into the upper exposed intervertebral disc space. We then obtained intraoperative radiographs confirm our location.  I then used electrocautery to detach the medial border of the longus colli muscle bilaterally from the C3-4 intervertebral disc spaces. I then inserted the Caspar self-retaining retractor underneath the longus colli muscle bilaterally to provide exposure.  We then incised the intervertebral disc at C3-4. We then performed a partial intervertebral discectomy with a pituitary forceps and the Karlin curettes. I then inserted distraction screws into the vertebral bodies at C3-4. We then distracted the interspace. We then used the high-speed drill to decorticate the vertebral endplates at C3-4, to drill away the remainder of the intervertebral disc, to drill away some posterior spondylosis, and to thin out the posterior longitudinal ligament. I then incised ligament with the arachnoid knife. We then removed the ligament with a Kerrison punches undercutting the vertebral endplates and decompressing the thecal sac. We then performed foraminotomies about the bilateral C4 nerve roots. This completed the decompression at this level.  We now turned our to attention to the interbody fusion. We used the trial spacers to determine the appropriate size for the interbody prosthesis. We then pre-filled prosthesis with a combination of local morcellized autograft bone that we obtained during decompression as well as Kinnex bone graft extender. We then inserted the prosthesis into the distracted interspace at C3-4. We then removed the distraction screws. There was a good snug fit of the prosthesis in the interspace.  Having completed the fusion we now turned attention to the anterior spinal instrumentation. We used the high-speed drill to drill away some anterior spondylosis at the disc spaces so that the plate lay down flat. We selected the appropriate length titanium anterior  cervical plate. We laid it along the anterior aspect of  the vertebral bodies from C3-4. We then drilled 14 mm holes at C3 and C4. We then secured the plate to the vertebral bodies by placing two 14 mm self-tapping screws at C3 and C4. We then obtained intraoperative radiograph. The demonstrating good position of the instrumentation. We therefore secured the screws the plate the locking each cam. This completed the instrumentation.  We then obtained hemostasis using bipolar electrocautery. We irrigated the wound out with bacitracin solution. We then removed the retractor. We inspected the esophagus for any damage. There was none apparent. We then reapproximated patient's platysmal muscle with interrupted 3-0 Vicryl suture. We then reapproximated the subcutaneous tissue with interrupted 3-0 Vicryl suture. The skin was reapproximated with Steri-Strips and benzoin. The wound was then covered with bacitracin ointment. A sterile dressing was applied. The drapes were removed. Patient was subsequently extubated by the anesthesia team and transported to the post anesthesia care unit in stable condition. All sponge instrument and needle counts were reportedly correct at the end of this case.

## 2018-01-15 NOTE — Plan of Care (Signed)
  Problem: Education: Goal: Ability to verbalize activity precautions or restrictions will improve Outcome: Progressing Goal: Knowledge of the prescribed therapeutic regimen will improve Outcome: Progressing Goal: Understanding of discharge needs will improve Outcome: Progressing   Problem: Activity: Goal: Ability to avoid complications of mobility impairment will improve Outcome: Progressing Goal: Ability to tolerate increased activity will improve Outcome: Progressing Goal: Will remain free from falls Outcome: Progressing   Problem: Pain Management: Goal: Pain level will decrease Outcome: Progressing   Problem: Bladder/Genitourinary: Goal: Urinary functional status for postoperative course will improve Outcome: Progressing   Problem: Coping: Goal: Level of anxiety will decrease Outcome: Progressing   Problem: Pain Managment: Goal: General experience of comfort will improve Outcome: Progressing   Problem: Safety: Goal: Ability to remain free from injury will improve Outcome: Progressing

## 2018-01-15 NOTE — Progress Notes (Signed)
Subjective: The patient is alert and a bit confused.  He is in no apparent distress.  Objective: Vital signs in last 24 hours: Temp:  [98.1 F (36.7 C)] 98.1 F (36.7 C) (12/02 1050) Pulse Rate:  [73] 73 (12/02 1050) Resp:  [18] 18 (12/02 1050) BP: (168)/(78) 168/78 (12/02 1050) SpO2:  [97 %] 97 % (12/02 1050) Weight:  [132.5 kg] 132.5 kg (12/02 1050) Estimated body mass index is 37.49 kg/m as calculated from the following:   Height as of this encounter: 6\' 2"  (1.88 m).   Weight as of this encounter: 132.5 kg.   Intake/Output from previous day: No intake/output data recorded. Intake/Output this shift: Total I/O In: 1000 [I.V.:1000] Out: 50 [Blood:50]  Physical exam the patient is alert and moving all 4 extremities well.  His dressing is clean and dry.  Lab Results: Recent Labs    01/15/18 1114  WBC 7.8  HGB 13.5  HCT 43.8  PLT 219   BMET Recent Labs    01/15/18 1114  NA 136  K 3.7  CL 103  CO2 22  GLUCOSE 137*  BUN 19  CREATININE 1.45*  CALCIUM 9.4    Studies/Results: No results found.  Assessment/Plan: The patient is doing well.  LOS: 0 days     Cristi LoronJeffrey D Omario Ander 01/15/2018, 2:57 PM

## 2018-01-15 NOTE — Transfer of Care (Signed)
Immediate Anesthesia Transfer of Care Note  Patient: Nathan Franco  Procedure(s) Performed: ANTERIOR CERVICAL DECOMPRESSION/DISCECTOMY FUSION, INTERBODY PROSTHESIS, PLATE/SCREWS CERVICAL THREE- CERVICAL FOUR (N/A Spine Cervical)  Patient Location: PACU  Anesthesia Type:General  Level of Consciousness: awake, alert  and oriented  Airway & Oxygen Therapy: Patient Spontanous Breathing and Patient connected to face mask oxygen  Post-op Assessment: Report given to RN and Post -op Vital signs reviewed and stable  Post vital signs: Reviewed and stable  Last Vitals:  Vitals Value Taken Time  BP 168/84 01/15/2018  2:56 PM  Temp    Pulse 81 01/15/2018  2:59 PM  Resp 13 01/15/2018  2:59 PM  SpO2 95 % 01/15/2018  2:59 PM  Vitals shown include unvalidated device data.  Last Pain:  Vitals:   01/15/18 1116  TempSrc:   PainSc: 0-No pain      Patients Stated Pain Goal: 7 (01/15/18 1116)  Complications: No apparent anesthesia complications

## 2018-01-15 NOTE — H&P (Signed)
Subjective: The patient is a 57 year old black male who has presented with Quadraparesis and numbness.  He was worked up with a cervical MRI which demonstrated spinal stenosis at C3-4.  I discussed the various treatment option with the patient including surgery.  He has weighed the risks, benefits and alternatives surgery and decided proceed with a C3-4 anterior cervical discectomy, fusion and plating.  Past Medical History:  Diagnosis Date  . Anxiety   . Constipation   . COPD (chronic obstructive pulmonary disease) (HCC)   . Coronary artery disease   . Diabetes mellitus without complication (HCC)    Type II  . Dyspnea   . Neuropathy    feet anad hands  . Pneumonia     Past Surgical History:  Procedure Laterality Date  . CARDIAC CATHETERIZATION  09/2015   stent  . CIRCUMCISION    . COLONOSCOPY W/ POLYPECTOMY    . KNEE ARTHROSCOPY Left    ACL  . VASECTOMY      Allergies  Allergen Reactions  . Latex Rash    On hands    Social History   Tobacco Use  . Smoking status: Former Smoker    Years: 25.00    Types: Cigarettes    Last attempt to quit: 2017    Years since quitting: 2.9  . Smokeless tobacco: Never Used  Substance Use Topics  . Alcohol use: Not Currently    History reviewed. No pertinent family history. Prior to Admission medications   Medication Sig Start Date End Date Taking? Authorizing Provider  albuterol (PROVENTIL HFA;VENTOLIN HFA) 108 (90 Base) MCG/ACT inhaler Inhale 2 puffs into the lungs every 6 (six) hours as needed for wheezing or shortness of breath.    Yes [provider]  ALPRAZolam Prudy Feeler) 0.5 MG tablet Take 0.5 mg by mouth 3 (three) times daily as needed (for panic/anxiety attacks.).  12/30/15  Yes [provider]  aspirin EC 81 MG tablet Take 81 mg by mouth daily.  09/20/16  Yes [provider]  azelastine (ASTELIN) 0.1 % nasal spray Place 1 spray into both nostrils 2 (two) times daily as needed (cold/flu-like symptoms).     Yes [provider]  fluticasone furoate-vilanterol (BREO ELLIPTA) 100-25 MCG/INH AEPB Inhale 1 puff into the lungs daily.    Yes [provider]  gabapentin (NEURONTIN) 400 MG capsule Take 400 mg by mouth 4 (four) times daily.    Yes [provider]  HYDROcodone-acetaminophen (NORCO) 10-325 MG tablet Take 1 tablet by mouth every 6 (six) hours as needed (pain.).    Yes [provider]  hydrOXYzine (ATARAX/VISTARIL) 25 MG tablet Take 25 mg by mouth daily.   Yes [provider]  ipratropium-albuterol (DUONEB) 0.5-2.5 (3) MG/3ML SOLN 3 mLs every 6 (six) hours as needed (wheezing/shortness of breath).  06/23/16  Yes [provider]  JANUVIA 100 MG tablet Take 100 mg by mouth daily. 11/27/17  Yes [provider]  labetalol (NORMODYNE) 200 MG tablet Take 200 mg by mouth 2 (two) times daily.   Yes [provider]  lubiprostone (AMITIZA) 24 MCG capsule Take 24 mcg by mouth 3 (three) times daily after meals.    Yes [provider]  methocarbamol (ROBAXIN) 500 MG tablet Take 500 mg by mouth 2 (two) times daily.    Yes [provider]  montelukast (SINGULAIR) 10 MG tablet Take 10 mg by mouth at bedtime.    Yes [provider]  olmesartan-hydrochlorothiazide (BENICAR HCT) 40-25 MG tablet Take 1 tablet  by mouth daily.   Yes [provider]  rosuvastatin (CRESTOR) 20 MG tablet Take 20 mg by mouth daily.    Yes [provider]  topiramate (TOPAMAX) 50 MG tablet Take 50 mg by mouth 2 (two) times daily.    Yes [provider]  umeclidinium bromide (INCRUSE ELLIPTA) 62.5 MCG/INH AEPB Inhale 1 puff into the lungs daily.   Yes [provider]     Review of Systems  Positive ROS: As above  All other systems have been reviewed and were otherwise negative with the exception of those mentioned in the HPI and as above.  Objective: Vital signs in last 24 hours: Temp:  [98.1 F (36.7  C)] 98.1 F (36.7 C) (12/02 1050) Pulse Rate:  [73] 73 (12/02 1050) Resp:  [18] 18 (12/02 1050) BP: (168)/(78) 168/78 (12/02 1050) SpO2:  [97 %] 97 % (12/02 1050) Weight:  [132.5 kg] 132.5 kg (12/02 1050) Estimated body mass index is 37.49 kg/m as calculated from the following:   Height as of this encounter: 6\' 2"  (1.88 m).   Weight as of this encounter: 132.5 kg.   General Appearance: Alert Head: Normocephalic, without obvious abnormality, atraumatic Eyes: PERRL, conjunctiva/corneas clear, EOM's intact,    Ears: Normal  Throat: Normal  Neck: Supple, Back: unremarkable Lungs: Clear to auscultation bilaterally, respirations unlabored Heart: Regular rate and rhythm, no murmur, rub or gallop Abdomen: Soft, non-tender Extremities: Extremities normal, atraumatic, no cyanosis or edema Skin: unremarkable  NEUROLOGIC:   Mental status: alert and oriented,Motor Exam -the patient is mildly diffusely weak.  He has weakness in his bilateral hands at 4/5. Sensory Exam -he has diffuse numbness Reflexes:  Coordination - grossly normal Gait -unsteady Balance -unsteady Cranial Nerves: I: smell Not tested  II: visual acuity  OS: Normal  OD: Normal   II: visual fields Full to confrontation  II: pupils Equal, round, reactive to light  III,VII: ptosis None  III,IV,VI: extraocular muscles  Full ROM  V: mastication Normal  V: facial light touch sensation  Normal  V,VII: corneal reflex  Present  VII: facial muscle function - upper  Normal  VII: facial muscle function - lower Normal  VIII: hearing Not tested  IX: soft palate elevation  Normal  IX,X: gag reflex Present  XI: trapezius strength  5/5  XI: sternocleidomastoid strength 5/5  XI: neck flexion strength  5/5  XII: tongue strength  Normal    Data Review Lab Results  Component Value Date   WBC 7.8 01/15/2018   HGB 13.5 01/15/2018   HCT 43.8 01/15/2018   MCV 92.6 01/15/2018   PLT 219 01/15/2018   Lab Results  Component  Value Date   NA 136 01/15/2018   K 3.7 01/15/2018   CL 103 01/15/2018   CO2 22 01/15/2018   BUN 19 01/15/2018   CREATININE 1.45 (H) 01/15/2018   GLUCOSE 137 (H) 01/15/2018   No results found for: INR, PROTIME  Assessment/Plan: C3-4 herniated disc, spondylosis, stenosis, cervicalgia, cervical radiculopathy, cervical myelopathy: I have discussed the situation with the patient and his wife.  I reviewed his imaging studies with him and pointed out the abnormalities.  We have discussed the various treatment options including surgery.  I have recommended a C3-4 anterior cervical discectomy, fusion and plating.  I have described the surgery to them.  I have shown him surgical models.  I have given them a surgical pamphlet.  We have discussed the risks, benefits, alternatives, expected postoperative course, and likelihood of achieving  our goals with surgery.  I have answered all their questions.  He has decided to proceed with surgery.   Cristi Loron 01/15/2018 12:02 PM

## 2018-01-16 ENCOUNTER — Other Ambulatory Visit: Payer: Self-pay

## 2018-01-16 DIAGNOSIS — M4802 Spinal stenosis, cervical region: Secondary | ICD-10-CM | POA: Diagnosis not present

## 2018-01-16 DIAGNOSIS — M4722 Other spondylosis with radiculopathy, cervical region: Secondary | ICD-10-CM | POA: Diagnosis not present

## 2018-01-16 DIAGNOSIS — M4712 Other spondylosis with myelopathy, cervical region: Secondary | ICD-10-CM | POA: Diagnosis not present

## 2018-01-16 DIAGNOSIS — E1140 Type 2 diabetes mellitus with diabetic neuropathy, unspecified: Secondary | ICD-10-CM | POA: Diagnosis not present

## 2018-01-16 DIAGNOSIS — I2510 Atherosclerotic heart disease of native coronary artery without angina pectoris: Secondary | ICD-10-CM | POA: Diagnosis not present

## 2018-01-16 DIAGNOSIS — M5001 Cervical disc disorder with myelopathy,  high cervical region: Secondary | ICD-10-CM | POA: Diagnosis not present

## 2018-01-16 LAB — GLUCOSE, CAPILLARY
Glucose-Capillary: 234 mg/dL — ABNORMAL HIGH (ref 70–99)
Glucose-Capillary: 166 mg/dL — ABNORMAL HIGH (ref 70–99)

## 2018-01-16 MED ORDER — OXYCODONE HCL 5 MG PO TABS: 5.0000 mg | ORAL_TABLET | ORAL | 0 refills | Status: DC | PRN

## 2018-01-16 NOTE — Progress Notes (Signed)
Patient is discharged form room 3C05 at this time. Alert and in stable condition. IV site d/c'd and instructions read to patient and spouse with understanding verbalized. Left unit via wheelchair with all belongings at side.

## 2018-01-16 NOTE — Evaluation (Signed)
Occupational Therapy Evaluation Patient Details Name: Nathan Franco MRN: [id number] DOB: Apr 13, 1960 Today's Date: 01/16/2018    History of Present Illness 57 yo male s/p ACDF C3-4 PMH: COPD, CAD. DMII, neuropathy   Clinical Impression   Patient is s/p ACDF C3-4 surgery resulting in functional limitations due to the deficits listed below (see OT problem list). Pt currently with bil Ue decr fine motor and gross motor hand function that affects ADLS. Pt reports some incr sensation in hands compared to prior to surgery. Pt with multiple questions and hx of falls 15-20 per wife since September. OT requesting PT consult for balance at this time. OT spoke directly to PT concerning balance. If needed will complete a second session to address balance with adls.  Patient will benefit from skilled OT acutely to increase independence and safety with ADLS to allow discharge follow up with MD pending Outpatient referral for hands.     Follow Up Recommendations  Follow surgeon's recommendation for DC plan and follow-up therapies;Outpatient OT    Equipment Recommendations  None recommended by OT    Recommendations for Other Services       Precautions / Restrictions Precautions Precautions: Fall Precaution Comments: handout provided for fine motor/ theraputty yellow/ cervical precautions Required Braces or Orthoses: Cervical Brace Cervical Brace: Hard collar;At all times;Other (comment)(off for shower)      Mobility Bed Mobility Overal bed mobility: Modified Independent             General bed mobility comments: min cues on sequence due to surgery but completed from 10degree elevated bed surface  Transfers                 General transfer comment: PT arriving and pending progress with PT    Balance Overall balance assessment: History of Falls                                         ADL either performed or assessed with clinical judgement   ADL Overall  ADL's : Needs assistance/impaired Eating/Feeding: Modified independent Eating/Feeding Details (indicate cue type and reason): educated on positioning bil UE Grooming: Wash/dry face;Wash/dry hands;Modified independent;Sitting   Upper Body Bathing: Minimal assistance   Lower Body Bathing: Moderate assistance   Upper Body Dressing : Minimal assistance   Lower Body Dressing: Moderate assistance         Toileting - Clothing Manipulation Details (indicate cue type and reason): pt expressed deficits with toilet hygiene and educated on peri care with tongs wet wipes or wife (A). pt reports "ill try wet wipes first"    Tub/Shower Transfer Details (indicate cue type and reason): discussed tub transfer in detail with R LE in first and L LE second. pt with baseline L knee and decr hip flexion that requires wife (A). wife reports previous fall in bathroom due to attempts alone   General ADL Comments: pt educated on bathing, hygiene, toileting, bed mobility, brace, fine motor hand exercises (provided yellow putty with demo), handouts provided x3, positioning in chair, reduced use of chair for lifting into standing pt should use activation of BIL LE to help stand out of chair from an elevated height, discussed sleeping , discussed medication management, discussed, shaving face, discussed positiong for granddaughter 59 years old and positioning of a book to read or cell phone use     Vision Baseline Vision/History: Wears glasses Vision Assessment?: No  apparent visual deficits     Perception     Praxis      Pertinent Vitals/Pain Pain Assessment: Faces Faces Pain Scale: Hurts little more Pain Location: throat Pain Descriptors / Indicators: Grimacing Pain Intervention(s): Monitored during session;Premedicated before session;Repositioned     Hand Dominance Left   Extremity/Trunk Assessment Upper Extremity Assessment Upper Extremity Assessment: RUE deficits/detail;LUE deficits/detail RUE  Deficits / Details: pt reports sensation is better in the  R UE than L UE AROM to 90 degrees, noted to have complete numbness in the palm of the hand with normal sensation at the 5th digit . pt reports pins / needles in the 1-3 digits RUE Sensation: decreased light touch RUE Coordination: decreased fine motor;decreased gross motor LUE Deficits / Details: AROM to 90 degrees, noted to have complete numbness in the palm of the hand with normal sensation at the 5th digit . pt reports pins / needles in the 1-3 digits LUE Sensation: decreased light touch LUE Coordination: decreased fine motor;decreased gross motor   Lower Extremity Assessment Lower Extremity Assessment: Defer to PT evaluation   Cervical / Trunk Assessment Cervical / Trunk Assessment: Other exceptions(aspen colloar) Cervical / Trunk Exceptions: pt educated with wife on don doff of brace. pt is L handed and demonstrates L side loosen   Communication Communication Communication: No difficulties   Cognition Arousal/Alertness: Awake/alert Behavior During Therapy: WFL for tasks assessed/performed Overall Cognitive Status: Within Functional Limits for tasks assessed                                     General Comments  dressing intact and dry with lateral left side dried blood size of nickel    Exercises Exercises: Other exercises Other Exercises Other Exercises: fine motor handout- first 6 exercises with education on grading and progressing within next two weeks to add additional exercise Other Exercises: theraputty handout Other Exercises: activation of scapula in all planes in a controlled static sitting environment   Shoulder Instructions      Home Living Family/patient expects to be discharged to:: Private residence Living Arrangements: Spouse/significant other Available Help at Discharge: Family Type of Home: House Home Access: Stairs to enter Secretary/administratorntrance Stairs-Number of Steps: 3   Home Layout: Two  level Alternate Level Stairs-Number of Steps: 5-6   Bathroom Shower/Tub: Chief Strategy OfficerTub/shower unit   Bathroom Toilet: Standard     Home Equipment: Environmental consultantWalker - 2 wheels;Cane - single point;Shower seat;Hand held shower head;Other (comment)(lift chair)   Additional Comments: wife has been assisting with bathing due to balance. pt with 15-20 falls since Sept 2019      Prior Functioning/Environment Level of Independence: Independent with assistive device(s)        Comments: walks with RW from wife insisting but prefers the cane. pt with bend in cane from falling with it        OT Problem List: Decreased strength;Decreased activity tolerance;Impaired balance (sitting and/or standing);Decreased coordination;Decreased knowledge of use of DME or AE;Decreased knowledge of precautions;Obesity;Impaired UE functional use;Pain      OT Treatment/Interventions: Self-care/ADL training;Therapeutic exercise;Neuromuscular education;Energy conservation;DME and/or AE instruction;Manual therapy;Modalities;Therapeutic activities;Patient/family education;Balance training    OT Goals(Current goals can be found in the care plan section) Acute Rehab OT Goals Patient Stated Goal: to get lower back surgery next OT Goal Formulation: With patient/family Time For Goal Achievement: 01/30/18 Potential to Achieve Goals: Good  OT Frequency: Min 3X/week   Barriers to D/C:  Co-evaluation              AM-PAC OT "6 Clicks" Daily Activity     Outcome Measure Help from another person eating meals?: None Help from another person taking care of personal grooming?: A Little Help from another person toileting, which includes using toliet, bedpan, or urinal?: A Little Help from another person bathing (including washing, rinsing, drying)?: A Lot Help from another person to put on and taking off regular upper body clothing?: A Little Help from another person to put on and taking off regular lower body clothing?: A  Lot 6 Click Score: 17   End of Session Equipment Utilized During Treatment: Rolling walker;Cervical collar Nurse Communication: Mobility status;Precautions  Activity Tolerance: Patient tolerated treatment well Patient left: Other (comment);with call bell/phone within reach;with family/visitor present(sitting EOB)  OT Visit Diagnosis: Unsteadiness on feet (R26.81);Repeated falls (R29.6);Muscle weakness (generalized) (M62.81)                Time: 1610-9604 OT Time Calculation (min): 54 min Charges:  OT General Charges $OT Visit: 1 Visit OT Evaluation $OT Eval Moderate Complexity: 1 Mod OT Treatments $Self Care/Home Management : 8-22 mins $Therapeutic Exercise: 8-22 mins   Mateo Flow, OTR/L  Acute Rehabilitation Services Pager: [phone] Office: [phone] .   Mateo Flow 01/16/2018, 8:37 AM

## 2018-01-16 NOTE — Evaluation (Signed)
Physical Therapy Evaluation Patient Details Name: Nathan PuntDavid Robert Capizzi MRN: [id number] DOB: 04/20/1960 Today's Date: 01/16/2018   History of Present Illness  Pt is a 57 yo male s/p ACDF C3-4. PMH: COPD, CAD. DMII, neuropathy  Clinical Impression  Pt admitted with above diagnosis. Pt currently presents with decreased strength and endurance, B hand and LE neuropathy limiting his safety in bed mobility, transfers, amb with RW and ascend/descending stairs. Pt amb 250 feet with RW with min guard for safety, VC for safe AD use and maintenance of precautions. Pt ascend/descend 5 stairs with min guard, increased time and step to pattern, dyspnea 3/4, standing rest break after. Pt educated on maintenance of precautions during mobility and car transfers. Pt will benefit from skilled PT to increase their independence and safety with mobility to allow discharge to the venue listed below.       Follow Up Recommendations Outpatient PT(at surgeon's discretion)    Equipment Recommendations  None recommended by PT    Recommendations for Other Services       Precautions / Restrictions Precautions Precautions: Fall;Cervical Precaution Comments: handout provided by OT Required Braces or Orthoses: Cervical Brace Cervical Brace: Hard collar;At all times Restrictions Weight Bearing Restrictions: No      Mobility  Bed Mobility Overal bed mobility: Needs Assistance Bed Mobility: Rolling;Supine to Sit Rolling: Supervision   Supine to sit: Supervision;HOB elevated     General bed mobility comments: VC to maintain log roll, keep arms below shoulder height. Supine to sit once with HOB elevated and once with bed flat.    Transfers Overall transfer level: Needs assistance Equipment used: Rolling walker (2 wheeled) Transfers: Sit to/from Stand Sit to Stand: Min guard         General transfer comment: leaned heavily on RW, pulled from RW to stand and pushed down to power to stand.    Ambulation/Gait Ambulation/Gait assistance: Min guard Gait Distance (Feet): 250 Feet Assistive device: Rolling walker (2 wheeled) Gait Pattern/deviations: Decreased dorsiflexion - left;Step-through pattern;Decreased stride length;Trunk flexed Gait velocity: decreased Gait velocity interpretation: <1.8 ft/sec, indicate of risk for recurrent falls General Gait Details: VC to stay close to RW, maintain upright posture, look forward instead of down at feet. One standing break after stairs for 1 min.   Stairs Stairs: Yes Stairs assistance: Min guard Stair Management: One rail Left;Step to pattern Number of Stairs: 5 General stair comments: Step to pattern up, sideways step to pattern down. Dyspnea 3/4 after stairs, took standing break.   Wheelchair Mobility    Modified Rankin (Stroke Patients Only)       Balance Overall balance assessment: History of Falls;Needs assistance Sitting-balance support: Feet supported;Single extremity supported Sitting balance-Leahy Scale: Fair       Standing balance-Leahy Scale: Poor Standing balance comment: Requires RW or railing for static and dynamic standing balance                             Pertinent Vitals/Pain Pain Assessment: Faces Faces Pain Scale: Hurts a little bit Pain Location: neck, L knee durnig amb Pain Descriptors / Indicators: Grimacing Pain Intervention(s): Monitored during session    Home Living Family/patient expects to be discharged to:: Private residence Living Arrangements: Spouse/significant other Available Help at Discharge: Family Type of Home: House Home Access: Stairs to enter   Secretary/administratorntrance Stairs-Number of Steps: 3 Home Layout: Two level Home Equipment: Environmental consultantWalker - 2 wheels;Cane - single point;Shower seat;Hand held shower head;Other (comment) Additional  Comments: wife has been assisting with bathing due to balance. pt with 15-20 falls since Sept 2019    Prior Function Level of Independence:  Independent with assistive device(s)         Comments: walks with RW from wife insisting but prefers the cane. pt with bend in cane from falling with it     Hand Dominance   Dominant Hand: Left    Extremity/Trunk Assessment   Upper Extremity Assessment Upper Extremity Assessment: Defer to OT evaluation RUE Deficits / Details: pt reports sensation is better in the  R UE than L UE AROM to 90 degrees, noted to have complete numbness in the palm of the hand with normal sensation at the 5th digit . pt reports pins / needles in the 1-3 digits RUE Sensation: decreased light touch RUE Coordination: decreased fine motor;decreased gross motor LUE Deficits / Details: AROM to 90 degrees, noted to have complete numbness in the palm of the hand with normal sensation at the 5th digit . pt reports pins / needles in the 1-3 digits LUE Sensation: decreased light touch LUE Coordination: decreased fine motor;decreased gross motor    Lower Extremity Assessment Lower Extremity Assessment: Generalized weakness;RLE deficits/detail;LLE deficits/detail RLE Deficits / Details: neuropathy in feet LLE Deficits / Details: neuropathy in feet, decreased dorsiflexion through gait    Cervical / Trunk Assessment Cervical / Trunk Assessment: Other exceptions(aspen colloar) Cervical / Trunk Exceptions: pt educated with wife on don doff of brace. pt is L handed and demonstrates L side loosen  Communication   Communication: No difficulties  Cognition Arousal/Alertness: Awake/alert Behavior During Therapy: WFL for tasks assessed/performed Overall Cognitive Status: Within Functional Limits for tasks assessed                                        General Comments General comments (skin integrity, edema, etc.): Wife present throughout session.    Exercises Other Exercises Other Exercises: fine motor handout- first 6 exercises with education on grading and progressing within next two weeks to add  additional exercise Other Exercises: theraputty handout Other Exercises: activation of scapula in all planes in a controlled static sitting environment   Assessment/Plan    PT Assessment Patient needs continued PT services  PT Problem List Decreased strength;Decreased mobility;Decreased safety awareness;Decreased activity tolerance;Cardiopulmonary status limiting activity;Decreased balance;Impaired sensation;Decreased knowledge of use of DME;Pain       PT Treatment Interventions DME instruction;Therapeutic activities;Gait training;Therapeutic exercise;Patient/family education;Stair training;Balance training;Functional mobility training;Neuromuscular re-education    PT Goals (Current goals can be found in the Care Plan section)  Acute Rehab PT Goals Patient Stated Goal: to return home PT Goal Formulation: With patient Time For Goal Achievement: 01/30/18 Potential to Achieve Goals: Good    Frequency Min 5X/week   Barriers to discharge        Co-evaluation               AM-PAC PT "6 Clicks" Mobility  Outcome Measure Help needed turning from your back to your side while in a flat bed without using bedrails?: None Help needed moving from lying on your back to sitting on the side of a flat bed without using bedrails?: None Help needed moving to and from a bed to a chair (including a wheelchair)?: A Little Help needed standing up from a chair using your arms (e.g., wheelchair or bedside chair)?: A Little Help needed to walk in  hospital room?: A Little Help needed climbing 3-5 steps with a railing? : A Little 6 Click Score: 20    End of Session Equipment Utilized During Treatment: Gait belt;Cervical collar Activity Tolerance: Patient tolerated treatment well Patient left: in bed;with family/visitor present;with call bell/phone within reach Nurse Communication: Mobility status PT Visit Diagnosis: Unsteadiness on feet (R26.81);History of falling (Z91.81);Muscle weakness  (generalized) (M62.81)    Time: 8295-6213 PT Time Calculation (min) (ACUTE ONLY): 15 min   Charges:   PT Evaluation $PT Eval Moderate Complexity: 1 Mod         Pattricia Boss, SPT Acute Rehab Services [phone]   Pattricia Boss 01/16/2018, 9:29 AM

## 2018-01-16 NOTE — Discharge Summary (Signed)
Physician Discharge Summary  Patient ID: Nathan Franco MRN: [id number] DOB/AGE: 58/07/1960 58 y.o.  Admit date: 01/15/2018 Discharge date: 01/16/2018  Admission Diagnoses: Cervical disc, cervical stenosis, cervical myelopathy, cervicalgia, cervical radiculopathy  Discharge Diagnoses: The same Active Problems:   Cervical myelopathy [facility](HCC)   Discharged Condition: good  Hospital Course: I performed a C3-4 anterior cervical discectomy, fusion and plating on the patient on 01/15/2018.  The surgery went well.  The patient's postoperative course was unremarkable.  On postoperative day #1 the patient, and his wife, requested discharge to home.  They were given written and oral discharge instructions.  All their questions were answered.  Consults: Occupational Therapy Significant Diagnostic Studies: None Treatments: C3-4 anterior cervical discectomy, fusion and plating. Discharge Exam: Blood pressure (!) 143/87, pulse 92, temperature 98 F (36.7 C), temperature source Oral, resp. rate 20, height 6\' 2"  (1.88 m), weight 132.5 kg, SpO2 94 %. Patient is alert and pleasant.  His strength is normal except his right hand grip strength is 4+/5.  His dressing is clean and dry.  There is no hematoma or shift.  Disposition: Home  Discharge Instructions    Call MD for:  difficulty breathing, headache or visual disturbances   Complete by:  As directed    Call MD for:  extreme fatigue   Complete by:  As directed    Call MD for:  hives   Complete by:  As directed    Call MD for:  persistant dizziness or light-headedness   Complete by:  As directed    Call MD for:  persistant nausea and vomiting   Complete by:  As directed    Call MD for:  redness, tenderness, or signs of infection (pain, swelling, redness, odor or green/yellow discharge around incision site)   Complete by:  As directed    Call MD for:  severe uncontrolled pain   Complete by:  As directed    Call MD for:  temperature >100.4    Complete by:  As directed    Diet - low sodium heart healthy   Complete by:  As directed    Discharge instructions   Complete by:  As directed    Call [phone] for a followup appointment. Take a stool softener while you are using pain medications.   Driving Restrictions   Complete by:  As directed    Do not drive for 2 weeks.   Increase activity slowly   Complete by:  As directed    Lifting restrictions   Complete by:  As directed    Do not lift more than 5 pounds. No excessive bending or twisting.   May shower / Bathe   Complete by:  As directed    Remove the dressing for 3 days after surgery.  You may shower, but leave the incision alone.   Remove dressing in 48 hours   Complete by:  As directed    Your stitches are under the scan and will dissolve by themselves. The Steri-Strips will fall off after you take a few showers. Do not rub back or pick at the wound, Leave the wound alone.     Allergies as of 01/16/2018      Reactions   Latex Rash   On hands      Medication List    STOP taking these medications   HYDROcodone-acetaminophen 10-325 MG tablet Commonly known as:  NORCO     TAKE these medications   albuterol 108 (90 Base) MCG/ACT inhaler Commonly known  as:  PROVENTIL HFA;VENTOLIN HFA Inhale 2 puffs into the lungs every 6 (six) hours as needed for wheezing or shortness of breath.   ALPRAZolam 0.5 MG tablet Commonly known as:  XANAX Take 0.5 mg by mouth 3 (three) times daily as needed (for panic/anxiety attacks.).   aspirin EC 81 MG tablet Take 81 mg by mouth daily.   azelastine 0.1 % nasal spray Commonly known as:  ASTELIN Place 1 spray into both nostrils 2 (two) times daily as needed (cold/flu-like symptoms).   BREO ELLIPTA 100-25 MCG/INH Aepb Generic drug:  fluticasone furoate-vilanterol Inhale 1 puff into the lungs daily.   gabapentin 400 MG capsule Commonly known as:  NEURONTIN Take 400 mg by mouth 4 (four) times daily.   hydrOXYzine 25 MG  tablet Commonly known as:  ATARAX/VISTARIL Take 25 mg by mouth daily.   INCRUSE ELLIPTA 62.5 MCG/INH Aepb Generic drug:  umeclidinium bromide Inhale 1 puff into the lungs daily.   ipratropium-albuterol 0.5-2.5 (3) MG/3ML Soln Commonly known as:  DUONEB 3 mLs every 6 (six) hours as needed (wheezing/shortness of breath).   JANUVIA 100 MG tablet Generic drug:  sitaGLIPtin Take 100 mg by mouth daily.   labetalol 200 MG tablet Commonly known as:  NORMODYNE Take 200 mg by mouth 2 (two) times daily.   lubiprostone 24 MCG capsule Commonly known as:  AMITIZA Take 24 mcg by mouth 3 (three) times daily after meals.   methocarbamol 500 MG tablet Commonly known as:  ROBAXIN Take 500 mg by mouth 2 (two) times daily.   montelukast 10 MG tablet Commonly known as:  SINGULAIR Take 10 mg by mouth at bedtime.   olmesartan-hydrochlorothiazide 40-25 MG tablet Commonly known as:  BENICAR HCT Take 1 tablet by mouth daily.   oxyCODONE 5 MG immediate release tablet Commonly known as:  Oxy IR/ROXICODONE Take 1 tablet (5 mg total) by mouth every 4 (four) hours as needed for moderate pain ((score 4 to 6)).   rosuvastatin 20 MG tablet Commonly known as:  CRESTOR Take 20 mg by mouth daily.   topiramate 50 MG tablet Commonly known as:  TOPAMAX Take 50 mg by mouth 2 (two) times daily.        Signed: Cristi Loron 01/16/2018, 7:44 AM

## 2018-01-16 NOTE — Discharge Instructions (Signed)
Wound Care °Leave incision open to air. °You may shower. °Do not scrub directly on incision.  °Do not put any creams, lotions, or ointments on incision. °Activity °Walk each and every day, increasing distance each day. °No lifting greater than 5 lbs.  Avoid excessive neck motion. °No driving for 2 weeks; may ride as a passenger locally. °Wear neck brace at all times except when showering.  If provided soft collar, may wear for comfort unless otherwise instructed. °Diet °Resume your normal diet.  °Return to Work °Will be discussed at you follow up appointment. °Call Your Doctor If Any of These Occur °Redness, drainage, or swelling at the wound.  °Temperature greater than 101 degrees. °Severe pain not relieved by pain medication. °Increased difficulty swallowing. °Incision starts to come apart. °Follow Up Appt °Call today for appointment in 1-2 weeks ([phone]) or for problems.  If you have any hardware placed in your spine, you will need an x-ray before your appointment. °

## 2018-01-17 ENCOUNTER — Encounter (HOSPITAL_COMMUNITY): Payer: Self-pay | Admitting: Neurosurgery

## 2018-01-23 DIAGNOSIS — M4712 Other spondylosis with myelopathy, cervical region: Secondary | ICD-10-CM | POA: Diagnosis not present

## 2018-01-23 DIAGNOSIS — Z6836 Body mass index (BMI) 36.0-36.9, adult: Secondary | ICD-10-CM | POA: Diagnosis not present

## 2018-01-23 DIAGNOSIS — I10 Essential (primary) hypertension: Secondary | ICD-10-CM | POA: Diagnosis not present

## 2018-02-02 DIAGNOSIS — M4712 Other spondylosis with myelopathy, cervical region: Secondary | ICD-10-CM | POA: Diagnosis not present

## 2018-02-02 DIAGNOSIS — G5603 Carpal tunnel syndrome, bilateral upper limbs: Secondary | ICD-10-CM | POA: Diagnosis not present

## 2018-02-20 DIAGNOSIS — J189 Pneumonia, unspecified organism: Secondary | ICD-10-CM | POA: Diagnosis not present

## 2018-03-23 DIAGNOSIS — J209 Acute bronchitis, unspecified: Secondary | ICD-10-CM | POA: Diagnosis not present

## 2018-03-23 DIAGNOSIS — J449 Chronic obstructive pulmonary disease, unspecified: Secondary | ICD-10-CM | POA: Diagnosis not present

## 2018-03-25 DIAGNOSIS — J209 Acute bronchitis, unspecified: Secondary | ICD-10-CM | POA: Diagnosis not present

## 2018-03-25 DIAGNOSIS — J449 Chronic obstructive pulmonary disease, unspecified: Secondary | ICD-10-CM | POA: Diagnosis not present

## 2018-03-30 DIAGNOSIS — K35891 Other acute appendicitis without perforation, with gangrene: Secondary | ICD-10-CM | POA: Diagnosis not present

## 2018-03-30 DIAGNOSIS — J449 Chronic obstructive pulmonary disease, unspecified: Secondary | ICD-10-CM | POA: Diagnosis present

## 2018-03-30 DIAGNOSIS — E669 Obesity, unspecified: Secondary | ICD-10-CM | POA: Diagnosis present

## 2018-03-30 DIAGNOSIS — K37 Unspecified appendicitis: Secondary | ICD-10-CM | POA: Diagnosis not present

## 2018-03-30 DIAGNOSIS — K3532 Acute appendicitis with perforation and localized peritonitis, without abscess: Secondary | ICD-10-CM | POA: Diagnosis present

## 2018-03-30 DIAGNOSIS — Z6836 Body mass index (BMI) 36.0-36.9, adult: Secondary | ICD-10-CM | POA: Diagnosis not present

## 2018-03-30 DIAGNOSIS — Z7902 Long term (current) use of antithrombotics/antiplatelets: Secondary | ICD-10-CM | POA: Diagnosis not present

## 2018-03-30 DIAGNOSIS — Z7984 Long term (current) use of oral hypoglycemic drugs: Secondary | ICD-10-CM | POA: Diagnosis not present

## 2018-03-30 DIAGNOSIS — R739 Hyperglycemia, unspecified: Secondary | ICD-10-CM | POA: Diagnosis not present

## 2018-03-30 DIAGNOSIS — R1031 Right lower quadrant pain: Secondary | ICD-10-CM | POA: Diagnosis not present

## 2018-03-30 DIAGNOSIS — R0902 Hypoxemia: Secondary | ICD-10-CM | POA: Diagnosis not present

## 2018-03-30 DIAGNOSIS — K3580 Unspecified acute appendicitis: Secondary | ICD-10-CM | POA: Diagnosis not present

## 2018-03-30 DIAGNOSIS — Z7982 Long term (current) use of aspirin: Secondary | ICD-10-CM | POA: Diagnosis not present

## 2018-03-30 DIAGNOSIS — Z79899 Other long term (current) drug therapy: Secondary | ICD-10-CM | POA: Diagnosis not present

## 2018-03-30 DIAGNOSIS — I252 Old myocardial infarction: Secondary | ICD-10-CM | POA: Diagnosis not present

## 2018-03-30 DIAGNOSIS — J9811 Atelectasis: Secondary | ICD-10-CM | POA: Diagnosis not present

## 2018-03-30 DIAGNOSIS — Z955 Presence of coronary angioplasty implant and graft: Secondary | ICD-10-CM | POA: Diagnosis not present

## 2018-03-30 DIAGNOSIS — K388 Other specified diseases of appendix: Secondary | ICD-10-CM | POA: Diagnosis not present

## 2018-03-30 DIAGNOSIS — G4733 Obstructive sleep apnea (adult) (pediatric): Secondary | ICD-10-CM | POA: Diagnosis present

## 2018-03-30 DIAGNOSIS — Z87891 Personal history of nicotine dependence: Secondary | ICD-10-CM | POA: Diagnosis not present

## 2018-03-30 DIAGNOSIS — I2510 Atherosclerotic heart disease of native coronary artery without angina pectoris: Secondary | ICD-10-CM | POA: Diagnosis present

## 2018-03-30 DIAGNOSIS — E785 Hyperlipidemia, unspecified: Secondary | ICD-10-CM | POA: Diagnosis present

## 2018-03-30 DIAGNOSIS — E119 Type 2 diabetes mellitus without complications: Secondary | ICD-10-CM | POA: Diagnosis present

## 2018-03-30 DIAGNOSIS — I10 Essential (primary) hypertension: Secondary | ICD-10-CM | POA: Diagnosis not present

## 2018-03-30 DIAGNOSIS — I160 Hypertensive urgency: Secondary | ICD-10-CM | POA: Diagnosis present

## 2018-03-30 DIAGNOSIS — D72829 Elevated white blood cell count, unspecified: Secondary | ICD-10-CM | POA: Diagnosis not present

## 2018-03-30 DIAGNOSIS — R0602 Shortness of breath: Secondary | ICD-10-CM | POA: Diagnosis not present

## 2018-04-13 DIAGNOSIS — Z09 Encounter for follow-up examination after completed treatment for conditions other than malignant neoplasm: Secondary | ICD-10-CM | POA: Diagnosis not present

## 2018-04-20 DIAGNOSIS — Z09 Encounter for follow-up examination after completed treatment for conditions other than malignant neoplasm: Secondary | ICD-10-CM | POA: Diagnosis not present

## 2018-04-24 DIAGNOSIS — E1165 Type 2 diabetes mellitus with hyperglycemia: Secondary | ICD-10-CM | POA: Diagnosis not present

## 2018-04-24 DIAGNOSIS — Z9049 Acquired absence of other specified parts of digestive tract: Secondary | ICD-10-CM | POA: Diagnosis not present

## 2018-04-24 DIAGNOSIS — R1011 Right upper quadrant pain: Secondary | ICD-10-CM | POA: Diagnosis not present

## 2018-04-24 DIAGNOSIS — E785 Hyperlipidemia, unspecified: Secondary | ICD-10-CM | POA: Diagnosis not present

## 2018-04-24 DIAGNOSIS — I10 Essential (primary) hypertension: Secondary | ICD-10-CM | POA: Diagnosis not present

## 2018-04-27 DIAGNOSIS — I2510 Atherosclerotic heart disease of native coronary artery without angina pectoris: Secondary | ICD-10-CM | POA: Diagnosis not present

## 2018-04-27 DIAGNOSIS — J449 Chronic obstructive pulmonary disease, unspecified: Secondary | ICD-10-CM | POA: Diagnosis not present

## 2018-04-27 DIAGNOSIS — I351 Nonrheumatic aortic (valve) insufficiency: Secondary | ICD-10-CM | POA: Diagnosis not present

## 2018-04-27 DIAGNOSIS — E7849 Other hyperlipidemia: Secondary | ICD-10-CM | POA: Diagnosis not present

## 2018-04-27 DIAGNOSIS — I10 Essential (primary) hypertension: Secondary | ICD-10-CM | POA: Diagnosis not present

## 2018-05-03 DIAGNOSIS — Z6834 Body mass index (BMI) 34.0-34.9, adult: Secondary | ICD-10-CM | POA: Diagnosis not present

## 2018-05-03 DIAGNOSIS — B372 Candidiasis of skin and nail: Secondary | ICD-10-CM | POA: Diagnosis not present

## 2018-05-03 DIAGNOSIS — M1712 Unilateral primary osteoarthritis, left knee: Secondary | ICD-10-CM | POA: Diagnosis not present

## 2018-05-03 DIAGNOSIS — Z09 Encounter for follow-up examination after completed treatment for conditions other than malignant neoplasm: Secondary | ICD-10-CM | POA: Diagnosis not present

## 2018-05-15 DIAGNOSIS — M4712 Other spondylosis with myelopathy, cervical region: Secondary | ICD-10-CM | POA: Diagnosis not present

## 2018-05-15 DIAGNOSIS — M48061 Spinal stenosis, lumbar region without neurogenic claudication: Secondary | ICD-10-CM | POA: Diagnosis not present

## 2018-07-10 DIAGNOSIS — K5732 Diverticulitis of large intestine without perforation or abscess without bleeding: Secondary | ICD-10-CM | POA: Diagnosis not present

## 2018-07-11 DIAGNOSIS — R109 Unspecified abdominal pain: Secondary | ICD-10-CM | POA: Diagnosis not present

## 2018-07-11 DIAGNOSIS — J449 Chronic obstructive pulmonary disease, unspecified: Secondary | ICD-10-CM | POA: Diagnosis not present

## 2018-07-11 DIAGNOSIS — F411 Generalized anxiety disorder: Secondary | ICD-10-CM | POA: Diagnosis not present

## 2018-07-11 DIAGNOSIS — R102 Pelvic and perineal pain: Secondary | ICD-10-CM | POA: Diagnosis not present

## 2018-07-11 DIAGNOSIS — I10 Essential (primary) hypertension: Secondary | ICD-10-CM | POA: Diagnosis not present

## 2018-07-11 DIAGNOSIS — Z9049 Acquired absence of other specified parts of digestive tract: Secondary | ICD-10-CM | POA: Diagnosis not present

## 2018-07-11 DIAGNOSIS — E1165 Type 2 diabetes mellitus with hyperglycemia: Secondary | ICD-10-CM | POA: Diagnosis not present

## 2018-07-13 DIAGNOSIS — Z6836 Body mass index (BMI) 36.0-36.9, adult: Secondary | ICD-10-CM | POA: Diagnosis not present

## 2018-07-13 DIAGNOSIS — T8143XA Infection following a procedure, organ and space surgical site, initial encounter: Secondary | ICD-10-CM | POA: Diagnosis not present

## 2018-07-26 DIAGNOSIS — M5136 Other intervertebral disc degeneration, lumbar region: Secondary | ICD-10-CM | POA: Diagnosis not present

## 2018-08-06 DIAGNOSIS — M1712 Unilateral primary osteoarthritis, left knee: Secondary | ICD-10-CM | POA: Diagnosis not present

## 2018-08-07 DIAGNOSIS — T8143XA Infection following a procedure, organ and space surgical site, initial encounter: Secondary | ICD-10-CM | POA: Diagnosis not present

## 2018-08-27 DIAGNOSIS — I10 Essential (primary) hypertension: Secondary | ICD-10-CM | POA: Diagnosis not present

## 2018-08-27 DIAGNOSIS — E1165 Type 2 diabetes mellitus with hyperglycemia: Secondary | ICD-10-CM | POA: Diagnosis not present

## 2018-08-27 DIAGNOSIS — E538 Deficiency of other specified B group vitamins: Secondary | ICD-10-CM | POA: Diagnosis not present

## 2018-08-27 DIAGNOSIS — F411 Generalized anxiety disorder: Secondary | ICD-10-CM | POA: Diagnosis not present

## 2018-08-27 DIAGNOSIS — K219 Gastro-esophageal reflux disease without esophagitis: Secondary | ICD-10-CM | POA: Diagnosis not present

## 2018-10-08 DIAGNOSIS — R911 Solitary pulmonary nodule: Secondary | ICD-10-CM | POA: Diagnosis not present

## 2018-10-08 DIAGNOSIS — N62 Hypertrophy of breast: Secondary | ICD-10-CM | POA: Diagnosis not present

## 2018-10-08 DIAGNOSIS — Z122 Encounter for screening for malignant neoplasm of respiratory organs: Secondary | ICD-10-CM | POA: Diagnosis not present

## 2018-10-08 DIAGNOSIS — Z87891 Personal history of nicotine dependence: Secondary | ICD-10-CM | POA: Diagnosis not present

## 2018-10-16 DIAGNOSIS — M48061 Spinal stenosis, lumbar region without neurogenic claudication: Secondary | ICD-10-CM | POA: Diagnosis not present

## 2018-10-16 DIAGNOSIS — M4712 Other spondylosis with myelopathy, cervical region: Secondary | ICD-10-CM | POA: Diagnosis not present

## 2018-10-18 DIAGNOSIS — F411 Generalized anxiety disorder: Secondary | ICD-10-CM | POA: Diagnosis not present

## 2018-10-18 DIAGNOSIS — E1165 Type 2 diabetes mellitus with hyperglycemia: Secondary | ICD-10-CM | POA: Diagnosis not present

## 2018-10-18 DIAGNOSIS — I10 Essential (primary) hypertension: Secondary | ICD-10-CM | POA: Diagnosis not present

## 2018-10-18 DIAGNOSIS — K219 Gastro-esophageal reflux disease without esophagitis: Secondary | ICD-10-CM | POA: Diagnosis not present

## 2018-10-18 DIAGNOSIS — E538 Deficiency of other specified B group vitamins: Secondary | ICD-10-CM | POA: Diagnosis not present

## 2018-10-25 DIAGNOSIS — M48061 Spinal stenosis, lumbar region without neurogenic claudication: Secondary | ICD-10-CM | POA: Diagnosis not present

## 2018-10-25 DIAGNOSIS — M542 Cervicalgia: Secondary | ICD-10-CM | POA: Diagnosis not present

## 2018-10-29 DIAGNOSIS — Z87891 Personal history of nicotine dependence: Secondary | ICD-10-CM | POA: Diagnosis not present

## 2018-10-29 DIAGNOSIS — R079 Chest pain, unspecified: Secondary | ICD-10-CM | POA: Diagnosis not present

## 2018-10-29 DIAGNOSIS — E669 Obesity, unspecified: Secondary | ICD-10-CM | POA: Diagnosis not present

## 2018-10-29 DIAGNOSIS — R918 Other nonspecific abnormal finding of lung field: Secondary | ICD-10-CM | POA: Diagnosis not present

## 2018-10-29 DIAGNOSIS — G4733 Obstructive sleep apnea (adult) (pediatric): Secondary | ICD-10-CM | POA: Diagnosis not present

## 2018-10-29 DIAGNOSIS — J449 Chronic obstructive pulmonary disease, unspecified: Secondary | ICD-10-CM | POA: Diagnosis not present

## 2018-11-22 DIAGNOSIS — M1712 Unilateral primary osteoarthritis, left knee: Secondary | ICD-10-CM | POA: Diagnosis not present

## 2018-11-22 DIAGNOSIS — M25462 Effusion, left knee: Secondary | ICD-10-CM | POA: Diagnosis not present

## 2018-12-03 DIAGNOSIS — M48061 Spinal stenosis, lumbar region without neurogenic claudication: Secondary | ICD-10-CM | POA: Diagnosis not present

## 2019-01-03 DIAGNOSIS — M48062 Spinal stenosis, lumbar region with neurogenic claudication: Secondary | ICD-10-CM | POA: Diagnosis not present

## 2019-01-03 DIAGNOSIS — M542 Cervicalgia: Secondary | ICD-10-CM | POA: Diagnosis not present

## 2019-02-25 DIAGNOSIS — J3489 Other specified disorders of nose and nasal sinuses: Secondary | ICD-10-CM | POA: Diagnosis not present

## 2019-02-25 DIAGNOSIS — J189 Pneumonia, unspecified organism: Secondary | ICD-10-CM | POA: Diagnosis not present

## 2019-02-25 DIAGNOSIS — Z20828 Contact with and (suspected) exposure to other viral communicable diseases: Secondary | ICD-10-CM | POA: Diagnosis not present

## 2019-02-25 DIAGNOSIS — J449 Chronic obstructive pulmonary disease, unspecified: Secondary | ICD-10-CM | POA: Diagnosis not present

## 2019-03-07 DIAGNOSIS — M48062 Spinal stenosis, lumbar region with neurogenic claudication: Secondary | ICD-10-CM | POA: Diagnosis not present

## 2019-03-11 DIAGNOSIS — I351 Nonrheumatic aortic (valve) insufficiency: Secondary | ICD-10-CM | POA: Diagnosis not present

## 2019-04-09 DIAGNOSIS — J329 Chronic sinusitis, unspecified: Secondary | ICD-10-CM | POA: Diagnosis not present

## 2019-04-09 DIAGNOSIS — Z20828 Contact with and (suspected) exposure to other viral communicable diseases: Secondary | ICD-10-CM | POA: Diagnosis not present

## 2019-04-09 DIAGNOSIS — J40 Bronchitis, not specified as acute or chronic: Secondary | ICD-10-CM | POA: Diagnosis not present

## 2019-04-29 DIAGNOSIS — E669 Obesity, unspecified: Secondary | ICD-10-CM | POA: Diagnosis not present

## 2019-04-29 DIAGNOSIS — J449 Chronic obstructive pulmonary disease, unspecified: Secondary | ICD-10-CM | POA: Diagnosis not present

## 2019-04-29 DIAGNOSIS — G4733 Obstructive sleep apnea (adult) (pediatric): Secondary | ICD-10-CM | POA: Diagnosis not present

## 2019-04-29 DIAGNOSIS — Z87891 Personal history of nicotine dependence: Secondary | ICD-10-CM | POA: Diagnosis not present

## 2019-05-01 DIAGNOSIS — M48061 Spinal stenosis, lumbar region without neurogenic claudication: Secondary | ICD-10-CM | POA: Diagnosis not present

## 2019-05-01 DIAGNOSIS — M542 Cervicalgia: Secondary | ICD-10-CM | POA: Diagnosis not present

## 2019-05-01 DIAGNOSIS — M5136 Other intervertebral disc degeneration, lumbar region: Secondary | ICD-10-CM | POA: Diagnosis not present

## 2019-05-24 DIAGNOSIS — Z20828 Contact with and (suspected) exposure to other viral communicable diseases: Secondary | ICD-10-CM | POA: Diagnosis not present

## 2019-05-24 DIAGNOSIS — B9689 Other specified bacterial agents as the cause of diseases classified elsewhere: Secondary | ICD-10-CM | POA: Diagnosis not present

## 2019-05-24 DIAGNOSIS — J069 Acute upper respiratory infection, unspecified: Secondary | ICD-10-CM | POA: Diagnosis not present

## 2019-05-27 DIAGNOSIS — I252 Old myocardial infarction: Secondary | ICD-10-CM | POA: Diagnosis not present

## 2019-05-27 DIAGNOSIS — Z9989 Dependence on other enabling machines and devices: Secondary | ICD-10-CM | POA: Diagnosis not present

## 2019-05-27 DIAGNOSIS — J1282 Pneumonia due to coronavirus disease 2019: Secondary | ICD-10-CM | POA: Diagnosis not present

## 2019-05-27 DIAGNOSIS — R0602 Shortness of breath: Secondary | ICD-10-CM | POA: Diagnosis not present

## 2019-05-27 DIAGNOSIS — I2510 Atherosclerotic heart disease of native coronary artery without angina pectoris: Secondary | ICD-10-CM | POA: Diagnosis present

## 2019-05-27 DIAGNOSIS — E785 Hyperlipidemia, unspecified: Secondary | ICD-10-CM | POA: Diagnosis present

## 2019-05-27 DIAGNOSIS — J159 Unspecified bacterial pneumonia: Secondary | ICD-10-CM | POA: Diagnosis present

## 2019-05-27 DIAGNOSIS — U071 COVID-19: Secondary | ICD-10-CM | POA: Diagnosis not present

## 2019-05-27 DIAGNOSIS — D72828 Other elevated white blood cell count: Secondary | ICD-10-CM | POA: Diagnosis present

## 2019-05-27 DIAGNOSIS — E871 Hypo-osmolality and hyponatremia: Secondary | ICD-10-CM | POA: Diagnosis present

## 2019-05-27 DIAGNOSIS — F418 Other specified anxiety disorders: Secondary | ICD-10-CM | POA: Diagnosis present

## 2019-05-27 DIAGNOSIS — E1165 Type 2 diabetes mellitus with hyperglycemia: Secondary | ICD-10-CM | POA: Diagnosis present

## 2019-05-27 DIAGNOSIS — R0902 Hypoxemia: Secondary | ICD-10-CM | POA: Diagnosis not present

## 2019-05-27 DIAGNOSIS — N179 Acute kidney failure, unspecified: Secondary | ICD-10-CM | POA: Diagnosis present

## 2019-05-27 DIAGNOSIS — J189 Pneumonia, unspecified organism: Secondary | ICD-10-CM | POA: Diagnosis not present

## 2019-05-27 DIAGNOSIS — G4733 Obstructive sleep apnea (adult) (pediatric): Secondary | ICD-10-CM | POA: Diagnosis present

## 2019-05-27 DIAGNOSIS — I10 Essential (primary) hypertension: Secondary | ICD-10-CM | POA: Diagnosis present

## 2019-05-27 DIAGNOSIS — T380X5A Adverse effect of glucocorticoids and synthetic analogues, initial encounter: Secondary | ICD-10-CM | POA: Diagnosis present

## 2019-05-27 DIAGNOSIS — E119 Type 2 diabetes mellitus without complications: Secondary | ICD-10-CM | POA: Diagnosis not present

## 2019-05-27 DIAGNOSIS — Z955 Presence of coronary angioplasty implant and graft: Secondary | ICD-10-CM | POA: Diagnosis not present

## 2019-05-27 DIAGNOSIS — M1990 Unspecified osteoarthritis, unspecified site: Secondary | ICD-10-CM | POA: Diagnosis present

## 2019-05-28 DIAGNOSIS — E119 Type 2 diabetes mellitus without complications: Secondary | ICD-10-CM | POA: Diagnosis not present

## 2019-05-28 DIAGNOSIS — G4733 Obstructive sleep apnea (adult) (pediatric): Secondary | ICD-10-CM | POA: Diagnosis not present

## 2019-05-28 DIAGNOSIS — U071 COVID-19: Secondary | ICD-10-CM | POA: Diagnosis not present

## 2019-05-29 DIAGNOSIS — E119 Type 2 diabetes mellitus without complications: Secondary | ICD-10-CM | POA: Diagnosis not present

## 2019-05-29 DIAGNOSIS — U071 COVID-19: Secondary | ICD-10-CM | POA: Diagnosis not present

## 2019-05-29 DIAGNOSIS — G4733 Obstructive sleep apnea (adult) (pediatric): Secondary | ICD-10-CM | POA: Diagnosis not present

## 2019-05-30 DIAGNOSIS — U071 COVID-19: Secondary | ICD-10-CM | POA: Diagnosis not present

## 2019-05-30 DIAGNOSIS — G4733 Obstructive sleep apnea (adult) (pediatric): Secondary | ICD-10-CM | POA: Diagnosis not present

## 2019-05-30 DIAGNOSIS — E119 Type 2 diabetes mellitus without complications: Secondary | ICD-10-CM | POA: Diagnosis not present

## 2019-05-31 DIAGNOSIS — U071 COVID-19: Secondary | ICD-10-CM | POA: Diagnosis not present

## 2019-05-31 DIAGNOSIS — G4733 Obstructive sleep apnea (adult) (pediatric): Secondary | ICD-10-CM | POA: Diagnosis not present

## 2019-05-31 DIAGNOSIS — E119 Type 2 diabetes mellitus without complications: Secondary | ICD-10-CM | POA: Diagnosis not present

## 2019-06-01 DIAGNOSIS — E119 Type 2 diabetes mellitus without complications: Secondary | ICD-10-CM | POA: Diagnosis not present

## 2019-06-01 DIAGNOSIS — G4733 Obstructive sleep apnea (adult) (pediatric): Secondary | ICD-10-CM | POA: Diagnosis not present

## 2019-06-01 DIAGNOSIS — U071 COVID-19: Secondary | ICD-10-CM | POA: Diagnosis not present

## 2019-06-02 DIAGNOSIS — E119 Type 2 diabetes mellitus without complications: Secondary | ICD-10-CM | POA: Diagnosis not present

## 2019-06-02 DIAGNOSIS — U071 COVID-19: Secondary | ICD-10-CM | POA: Diagnosis not present

## 2019-06-02 DIAGNOSIS — G4733 Obstructive sleep apnea (adult) (pediatric): Secondary | ICD-10-CM | POA: Diagnosis not present

## 2019-06-03 DIAGNOSIS — E119 Type 2 diabetes mellitus without complications: Secondary | ICD-10-CM | POA: Diagnosis not present

## 2019-06-03 DIAGNOSIS — G4733 Obstructive sleep apnea (adult) (pediatric): Secondary | ICD-10-CM | POA: Diagnosis not present

## 2019-06-03 DIAGNOSIS — U071 COVID-19: Secondary | ICD-10-CM | POA: Diagnosis not present

## 2019-06-04 DIAGNOSIS — N179 Acute kidney failure, unspecified: Secondary | ICD-10-CM | POA: Diagnosis not present

## 2019-06-04 DIAGNOSIS — U071 COVID-19: Secondary | ICD-10-CM | POA: Diagnosis not present

## 2019-06-04 DIAGNOSIS — I2510 Atherosclerotic heart disease of native coronary artery without angina pectoris: Secondary | ICD-10-CM | POA: Diagnosis not present

## 2019-06-04 DIAGNOSIS — J439 Emphysema, unspecified: Secondary | ICD-10-CM | POA: Diagnosis not present

## 2019-06-04 DIAGNOSIS — J1282 Pneumonia due to coronavirus disease 2019: Secondary | ICD-10-CM | POA: Diagnosis not present

## 2019-06-04 DIAGNOSIS — Z7982 Long term (current) use of aspirin: Secondary | ICD-10-CM | POA: Diagnosis not present

## 2019-06-04 DIAGNOSIS — E871 Hypo-osmolality and hyponatremia: Secondary | ICD-10-CM | POA: Diagnosis not present

## 2019-06-04 DIAGNOSIS — M1990 Unspecified osteoarthritis, unspecified site: Secondary | ICD-10-CM | POA: Diagnosis not present

## 2019-06-04 DIAGNOSIS — I10 Essential (primary) hypertension: Secondary | ICD-10-CM | POA: Diagnosis not present

## 2019-06-04 DIAGNOSIS — Z791 Long term (current) use of non-steroidal anti-inflammatories (NSAID): Secondary | ICD-10-CM | POA: Diagnosis not present

## 2019-06-04 DIAGNOSIS — F329 Major depressive disorder, single episode, unspecified: Secondary | ICD-10-CM | POA: Diagnosis not present

## 2019-06-04 DIAGNOSIS — F419 Anxiety disorder, unspecified: Secondary | ICD-10-CM | POA: Diagnosis not present

## 2019-06-04 DIAGNOSIS — Z87891 Personal history of nicotine dependence: Secondary | ICD-10-CM | POA: Diagnosis not present

## 2019-06-04 DIAGNOSIS — E1165 Type 2 diabetes mellitus with hyperglycemia: Secondary | ICD-10-CM | POA: Diagnosis not present

## 2019-06-04 DIAGNOSIS — Z79891 Long term (current) use of opiate analgesic: Secondary | ICD-10-CM | POA: Diagnosis not present

## 2019-06-04 DIAGNOSIS — E669 Obesity, unspecified: Secondary | ICD-10-CM | POA: Diagnosis not present

## 2019-06-04 DIAGNOSIS — J9601 Acute respiratory failure with hypoxia: Secondary | ICD-10-CM | POA: Diagnosis not present

## 2019-06-04 DIAGNOSIS — Z7984 Long term (current) use of oral hypoglycemic drugs: Secondary | ICD-10-CM | POA: Diagnosis not present

## 2019-06-04 DIAGNOSIS — Z9989 Dependence on other enabling machines and devices: Secondary | ICD-10-CM | POA: Diagnosis not present

## 2019-06-04 DIAGNOSIS — G4733 Obstructive sleep apnea (adult) (pediatric): Secondary | ICD-10-CM | POA: Diagnosis not present

## 2019-06-04 DIAGNOSIS — D72829 Elevated white blood cell count, unspecified: Secondary | ICD-10-CM | POA: Diagnosis not present

## 2019-06-04 DIAGNOSIS — I252 Old myocardial infarction: Secondary | ICD-10-CM | POA: Diagnosis not present

## 2019-06-04 DIAGNOSIS — E785 Hyperlipidemia, unspecified: Secondary | ICD-10-CM | POA: Diagnosis not present

## 2019-06-04 DIAGNOSIS — Z7951 Long term (current) use of inhaled steroids: Secondary | ICD-10-CM | POA: Diagnosis not present

## 2019-06-04 DIAGNOSIS — Z9981 Dependence on supplemental oxygen: Secondary | ICD-10-CM | POA: Diagnosis not present

## 2019-06-19 DIAGNOSIS — J9611 Chronic respiratory failure with hypoxia: Secondary | ICD-10-CM | POA: Diagnosis not present

## 2019-06-19 DIAGNOSIS — E538 Deficiency of other specified B group vitamins: Secondary | ICD-10-CM | POA: Diagnosis not present

## 2019-06-19 DIAGNOSIS — Z9981 Dependence on supplemental oxygen: Secondary | ICD-10-CM | POA: Diagnosis not present

## 2019-06-19 DIAGNOSIS — E1165 Type 2 diabetes mellitus with hyperglycemia: Secondary | ICD-10-CM | POA: Diagnosis not present

## 2019-06-19 DIAGNOSIS — J449 Chronic obstructive pulmonary disease, unspecified: Secondary | ICD-10-CM | POA: Diagnosis not present

## 2019-07-01 DIAGNOSIS — M48061 Spinal stenosis, lumbar region without neurogenic claudication: Secondary | ICD-10-CM | POA: Diagnosis not present

## 2019-07-03 IMAGING — CT CT L SPINE W/ CM
1 of 5 series · 7 of 14 positions shown, 9 images · non-contrast
Comparison: none

CLINICAL DATA: Low back pain.  Neck pain.  Multiple falls.
TECHNIQUE: Contiguous axial images were obtained through the Cervical and
Lumbar spine after the intrathecal infusion of infusion. Coronal and
sagittal reconstructions were obtained of the axial image sets.

[Series 3: l spine soft · axial · 0.43mm/px · z∈[-695,-488]mm · 7 of 93 slices shown, 9 images]
[im 12/93  soft-tissue]
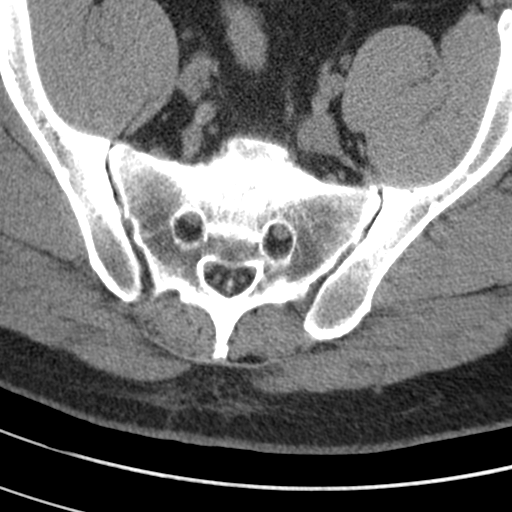
[im 12/93  bone]
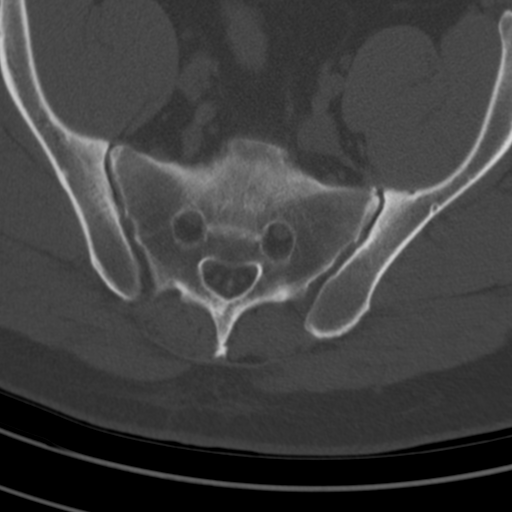
[im 24/93  bone]
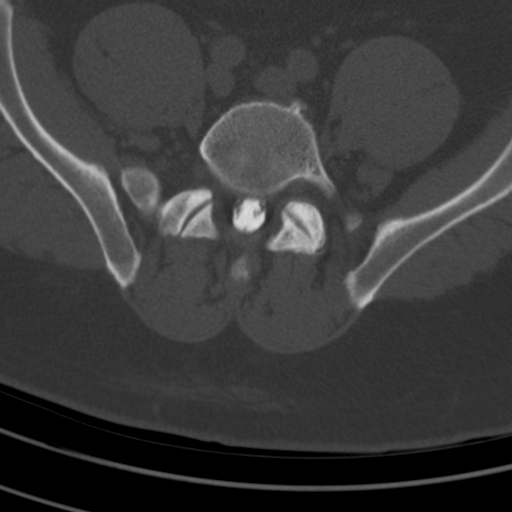
[im 35/93  bone]
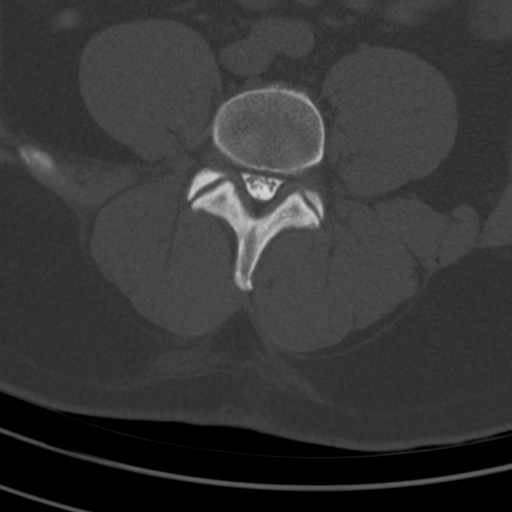
[im 47/93  bone]
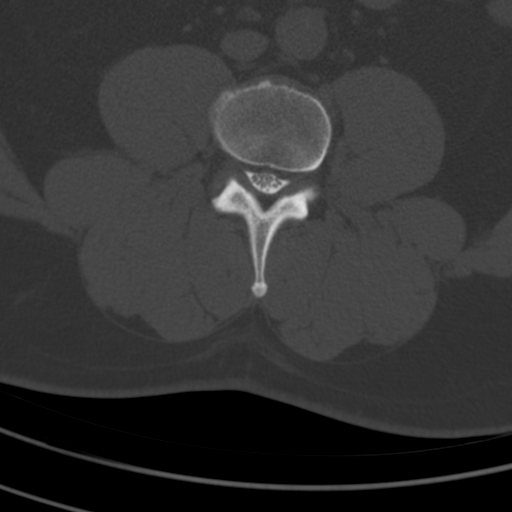
[im 58/93  soft-tissue]
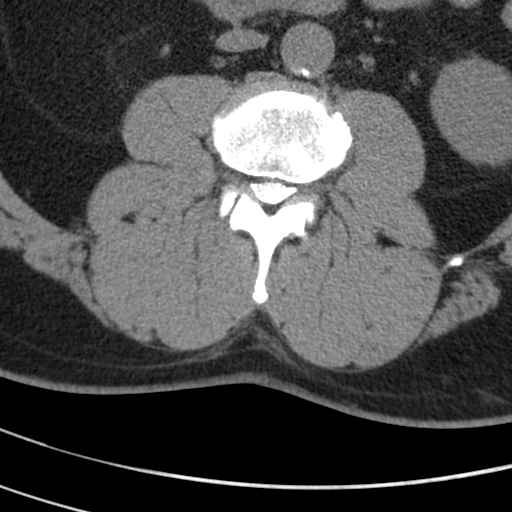
[im 58/93  bone]
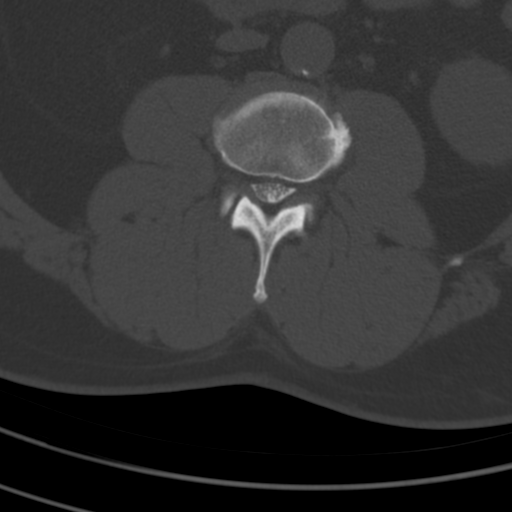
[im 70/93  bone]
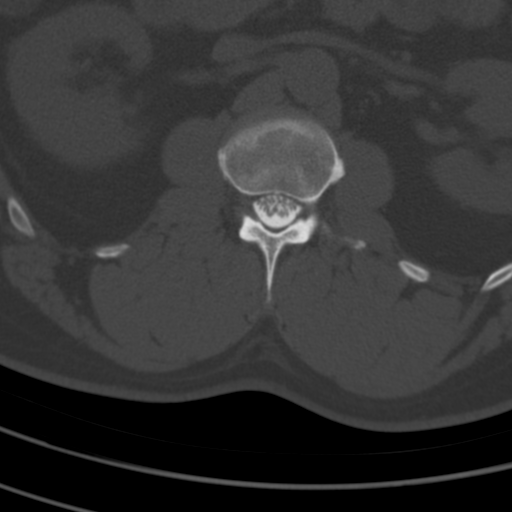
[im 81/93  bone]
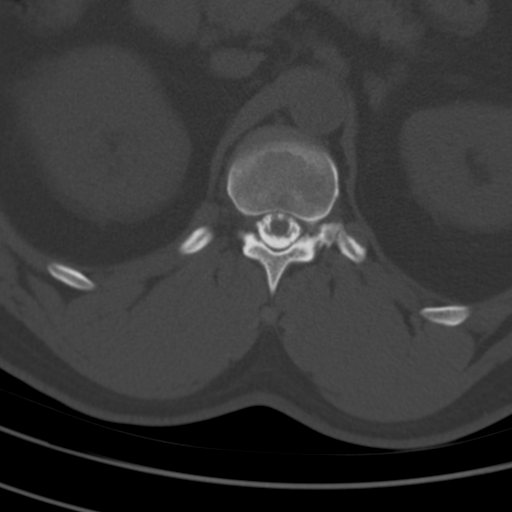

[7 of 14 positions shown; findings below may reference images not displayed]

FLUOROSCOPY TIME:  1 minutes and 52 seconds corresponding to a Dose
Area Product of 643.18 Gy*m2

PROCEDURE:
LUMBAR PUNCTURE FOR CERVICAL AND LUMBAR MYELOGRAM

CERVICAL AND LUMBAR MYELOGRAM

CT CERVICAL MYELOGRAM

CT LUMBAR MYELOGRAM

After thorough discussion of risks and benefits of the procedure
including bleeding, infection, injury to nerves, blood vessels,
adjacent structures as well as headache and CSF leak, written and
oral informed consent was obtained. Consent was obtained by Dr. Tiger
Tanis.

Patient was positioned prone on the fluoroscopy table. Local
anesthesia was provided with 1% lidocaine without epinephrine after
prepped and draped in the usual sterile fashion. Puncture was
performed at L4-5 using a 3 1/2 inch 22-gauge spinal needle via
midline approach. Using a single pass through the dura, the needle
was placed within the thecal sac, with return of clear CSF. 10 mL of
Isovue M-[id number] was injected into the thecal sac, with normal
opacification of the nerve roots and cauda equina consistent with
free flow within the subarachnoid space. The patient was then moved
to the trendelenburg position and contrast flowed into the Cervical
spine region.

I personally performed the lumbar puncture and administered the
intrathecal contrast. I also personally supervised acquisition of
the myelogram images.
FINDINGS: CERVICAL AND LUMBAR MYELOGRAM FINDINGS:

LUMBAR: Good opacification lumbar subarachnoid space. Anatomic
alignment. Congenital short pedicles. Severe stenosis at L2-3,
related to ventral defect consistent with protrusion as well as
posterior element hypertrophy.

Mild to moderate stenosis L3-4 and L4-5.

Standing flexion extension lumbar views demonstrate anatomic
alignment is maintained, and there is no dynamic instability.

CERVICAL: Good opacification cervical subarachnoid space. Anatomic
alignment is maintained. Congenital short pedicles. Moderate to
severe stenosis, related to central disc protrusion at C3-C4. Cord
displacement dorsally. BILATERAL C4 neural impingement. Similar
changes of stenosis, congenital and acquired, at C4-5, C5-6, and
C6-7 without significant nerve root cut off.

Lateral flexion extension cervical spine views demonstrate no
dynamic instability.

CT CERVICAL MYELOGRAM FINDINGS:

Alignment: Anatomic

Vertebrae: Short pedicles, but no worrisome osseous lesion. No
fracture.

Cord: Cord flattening at multiple levels, most prominent at C3-C4.

Posterior Fossa: No tonsillar herniation.

Vertebral Arteries: Not assessed.

Paraspinal tissues: No neck masses.

Disc levels:

C2-3: Severe LEFT facet arthropathy. LEFT C3 foraminal narrowing.
Mild stenosis without impingement.

C3-4: Severe congenital and acquired stenosis. Short pedicles with
central disc extrusion. Cord flattening. BILATERAL C4 foraminal
narrowing. Canal diameter 5-6 mm.

C4-5: Central protrusion. Short pedicles. Moderate stenosis. Cord
flattening. BILATERAL C5 foraminal narrowing.

C5-6: Central protrusion. Short pedicles. Moderate stenosis with
cord flattening. No definite C6 foraminal narrowing.

C6-7: Annular bulge. Short pedicles. No compressive stenosis or
neural impingement.

C7-T1:  Unremarkable.  BILATERAL facet arthropathy.  No impingement.

CT LUMBAR MYELOGRAM FINDINGS:

Segmentation: Normal.

Alignment:  Normal.

Vertebrae: No worrisome osseous lesion.Short pedicles.  No fracture.

Conus medullaris: Normal in size and location.

Paraspinal tissues: No hydronephrosis or paravertebral mass. Aortic
atherosclerosis.

Disc levels:

L1-L2:  Normal.

L2-L3: Severe congenital and acquired stenosis. Short pedicles, with
central protrusion/extrusion. BILATERAL L3 neural impingement.

L3-L4: Moderate congenital and acquired stenosis. Short pedicles.
Facet arthropathy, ligamentum flavum infolding. Central protrusion.
BILATERAL L4 neural impingement.

L4-L5: Mild congenital and acquired stenosis. Posterior element
hypertrophy. Short pedicles. Shallow central protrusion. Mild
BILATERAL L4 and L5 neural impingement.

L5-S1: Unremarkable disc space. Moderate LEFT facet arthropathy.
Subarticular zone or foraminal zone narrowing not established.
IMPRESSION: Severe congenital and acquired stenosis in the cervical region,
worst at C3-4. Canal diameter 5-6 mm. Significant cord compression
with BILATERAL C4 foraminal narrowing. Less severe congenital and
acquired stenosis C4-5 and C5-6.

Severe congenital and acquired stenosis in the lumbar region, worst
at L2-3. Similar less severe changes of stenosis at L3-4 and L4-5.

No dynamic instability in the cervical or lumbar region as seen on
lateral flexion extension plain films.

## 2019-07-04 DIAGNOSIS — F419 Anxiety disorder, unspecified: Secondary | ICD-10-CM | POA: Diagnosis not present

## 2019-07-04 DIAGNOSIS — Z9989 Dependence on other enabling machines and devices: Secondary | ICD-10-CM | POA: Diagnosis not present

## 2019-07-04 DIAGNOSIS — N179 Acute kidney failure, unspecified: Secondary | ICD-10-CM | POA: Diagnosis not present

## 2019-07-04 DIAGNOSIS — J439 Emphysema, unspecified: Secondary | ICD-10-CM | POA: Diagnosis not present

## 2019-07-04 DIAGNOSIS — E1165 Type 2 diabetes mellitus with hyperglycemia: Secondary | ICD-10-CM | POA: Diagnosis not present

## 2019-07-04 DIAGNOSIS — J9601 Acute respiratory failure with hypoxia: Secondary | ICD-10-CM | POA: Diagnosis not present

## 2019-07-04 DIAGNOSIS — E785 Hyperlipidemia, unspecified: Secondary | ICD-10-CM | POA: Diagnosis not present

## 2019-07-04 DIAGNOSIS — E871 Hypo-osmolality and hyponatremia: Secondary | ICD-10-CM | POA: Diagnosis not present

## 2019-07-04 DIAGNOSIS — I252 Old myocardial infarction: Secondary | ICD-10-CM | POA: Diagnosis not present

## 2019-07-04 DIAGNOSIS — D72829 Elevated white blood cell count, unspecified: Secondary | ICD-10-CM | POA: Diagnosis not present

## 2019-07-04 DIAGNOSIS — I10 Essential (primary) hypertension: Secondary | ICD-10-CM | POA: Diagnosis not present

## 2019-07-04 DIAGNOSIS — Z7982 Long term (current) use of aspirin: Secondary | ICD-10-CM | POA: Diagnosis not present

## 2019-07-04 DIAGNOSIS — G4733 Obstructive sleep apnea (adult) (pediatric): Secondary | ICD-10-CM | POA: Diagnosis not present

## 2019-07-04 DIAGNOSIS — U071 COVID-19: Secondary | ICD-10-CM | POA: Diagnosis not present

## 2019-07-04 DIAGNOSIS — I2510 Atherosclerotic heart disease of native coronary artery without angina pectoris: Secondary | ICD-10-CM | POA: Diagnosis not present

## 2019-07-04 DIAGNOSIS — Z9981 Dependence on supplemental oxygen: Secondary | ICD-10-CM | POA: Diagnosis not present

## 2019-07-04 DIAGNOSIS — Z87891 Personal history of nicotine dependence: Secondary | ICD-10-CM | POA: Diagnosis not present

## 2019-07-04 DIAGNOSIS — E669 Obesity, unspecified: Secondary | ICD-10-CM | POA: Diagnosis not present

## 2019-07-04 DIAGNOSIS — M1990 Unspecified osteoarthritis, unspecified site: Secondary | ICD-10-CM | POA: Diagnosis not present

## 2019-07-04 DIAGNOSIS — J1282 Pneumonia due to coronavirus disease 2019: Secondary | ICD-10-CM | POA: Diagnosis not present

## 2019-07-04 DIAGNOSIS — Z7984 Long term (current) use of oral hypoglycemic drugs: Secondary | ICD-10-CM | POA: Diagnosis not present

## 2019-07-04 DIAGNOSIS — F329 Major depressive disorder, single episode, unspecified: Secondary | ICD-10-CM | POA: Diagnosis not present

## 2019-07-04 DIAGNOSIS — Z791 Long term (current) use of non-steroidal anti-inflammatories (NSAID): Secondary | ICD-10-CM | POA: Diagnosis not present

## 2019-07-04 DIAGNOSIS — Z79891 Long term (current) use of opiate analgesic: Secondary | ICD-10-CM | POA: Diagnosis not present

## 2019-07-04 DIAGNOSIS — Z7951 Long term (current) use of inhaled steroids: Secondary | ICD-10-CM | POA: Diagnosis not present

## 2019-07-18 DIAGNOSIS — E1165 Type 2 diabetes mellitus with hyperglycemia: Secondary | ICD-10-CM | POA: Diagnosis not present

## 2019-07-18 DIAGNOSIS — D72829 Elevated white blood cell count, unspecified: Secondary | ICD-10-CM | POA: Diagnosis not present

## 2019-07-18 DIAGNOSIS — J9601 Acute respiratory failure with hypoxia: Secondary | ICD-10-CM | POA: Diagnosis not present

## 2019-07-18 DIAGNOSIS — U071 COVID-19: Secondary | ICD-10-CM | POA: Diagnosis not present

## 2019-07-18 DIAGNOSIS — J439 Emphysema, unspecified: Secondary | ICD-10-CM | POA: Diagnosis not present

## 2019-07-18 DIAGNOSIS — J1282 Pneumonia due to coronavirus disease 2019: Secondary | ICD-10-CM | POA: Diagnosis not present

## 2019-07-29 DIAGNOSIS — J439 Emphysema, unspecified: Secondary | ICD-10-CM | POA: Diagnosis not present

## 2019-07-29 DIAGNOSIS — E1165 Type 2 diabetes mellitus with hyperglycemia: Secondary | ICD-10-CM | POA: Diagnosis not present

## 2019-07-29 DIAGNOSIS — D72829 Elevated white blood cell count, unspecified: Secondary | ICD-10-CM | POA: Diagnosis not present

## 2019-07-29 DIAGNOSIS — J1282 Pneumonia due to coronavirus disease 2019: Secondary | ICD-10-CM | POA: Diagnosis not present

## 2019-07-29 DIAGNOSIS — J9601 Acute respiratory failure with hypoxia: Secondary | ICD-10-CM | POA: Diagnosis not present

## 2019-07-29 DIAGNOSIS — U071 COVID-19: Secondary | ICD-10-CM | POA: Diagnosis not present

## 2019-07-31 DIAGNOSIS — M5136 Other intervertebral disc degeneration, lumbar region: Secondary | ICD-10-CM | POA: Diagnosis not present

## 2019-07-31 DIAGNOSIS — M48062 Spinal stenosis, lumbar region with neurogenic claudication: Secondary | ICD-10-CM | POA: Diagnosis not present

## 2019-09-26 DIAGNOSIS — B353 Tinea pedis: Secondary | ICD-10-CM | POA: Diagnosis not present

## 2019-09-26 DIAGNOSIS — S90812A Abrasion, left foot, initial encounter: Secondary | ICD-10-CM | POA: Diagnosis not present

## 2019-09-26 DIAGNOSIS — Z79899 Other long term (current) drug therapy: Secondary | ICD-10-CM | POA: Diagnosis not present

## 2019-09-26 DIAGNOSIS — N183 Chronic kidney disease, stage 3 unspecified: Secondary | ICD-10-CM | POA: Diagnosis not present

## 2019-09-26 DIAGNOSIS — H6090 Unspecified otitis externa, unspecified ear: Secondary | ICD-10-CM | POA: Diagnosis not present

## 2019-09-26 DIAGNOSIS — E1165 Type 2 diabetes mellitus with hyperglycemia: Secondary | ICD-10-CM | POA: Diagnosis not present

## 2019-09-26 DIAGNOSIS — E785 Hyperlipidemia, unspecified: Secondary | ICD-10-CM | POA: Diagnosis not present

## 2019-09-26 DIAGNOSIS — N401 Enlarged prostate with lower urinary tract symptoms: Secondary | ICD-10-CM | POA: Diagnosis not present

## 2019-10-02 DIAGNOSIS — M1712 Unilateral primary osteoarthritis, left knee: Secondary | ICD-10-CM | POA: Diagnosis not present

## 2019-10-03 DIAGNOSIS — M48062 Spinal stenosis, lumbar region with neurogenic claudication: Secondary | ICD-10-CM | POA: Diagnosis not present

## 2019-10-10 DIAGNOSIS — Z122 Encounter for screening for malignant neoplasm of respiratory organs: Secondary | ICD-10-CM | POA: Diagnosis not present

## 2019-10-10 DIAGNOSIS — I2510 Atherosclerotic heart disease of native coronary artery without angina pectoris: Secondary | ICD-10-CM | POA: Diagnosis not present

## 2019-10-10 DIAGNOSIS — Z87891 Personal history of nicotine dependence: Secondary | ICD-10-CM | POA: Diagnosis not present

## 2019-10-10 DIAGNOSIS — R911 Solitary pulmonary nodule: Secondary | ICD-10-CM | POA: Diagnosis not present

## 2019-10-30 DIAGNOSIS — M5136 Other intervertebral disc degeneration, lumbar region: Secondary | ICD-10-CM | POA: Diagnosis not present

## 2019-10-30 DIAGNOSIS — M4802 Spinal stenosis, cervical region: Secondary | ICD-10-CM | POA: Diagnosis not present

## 2019-10-30 DIAGNOSIS — M48062 Spinal stenosis, lumbar region with neurogenic claudication: Secondary | ICD-10-CM | POA: Diagnosis not present

## 2019-10-30 DIAGNOSIS — M542 Cervicalgia: Secondary | ICD-10-CM | POA: Diagnosis not present

## 2019-11-01 DIAGNOSIS — G4733 Obstructive sleep apnea (adult) (pediatric): Secondary | ICD-10-CM | POA: Diagnosis not present

## 2019-11-01 DIAGNOSIS — E669 Obesity, unspecified: Secondary | ICD-10-CM | POA: Diagnosis not present

## 2019-11-01 DIAGNOSIS — R0602 Shortness of breath: Secondary | ICD-10-CM | POA: Diagnosis not present

## 2019-11-01 DIAGNOSIS — J449 Chronic obstructive pulmonary disease, unspecified: Secondary | ICD-10-CM | POA: Diagnosis not present

## 2019-11-04 DIAGNOSIS — R0789 Other chest pain: Secondary | ICD-10-CM | POA: Diagnosis not present

## 2019-11-04 DIAGNOSIS — I2510 Atherosclerotic heart disease of native coronary artery without angina pectoris: Secondary | ICD-10-CM | POA: Diagnosis not present

## 2019-11-04 DIAGNOSIS — I351 Nonrheumatic aortic (valve) insufficiency: Secondary | ICD-10-CM | POA: Diagnosis not present

## 2019-11-04 DIAGNOSIS — E785 Hyperlipidemia, unspecified: Secondary | ICD-10-CM | POA: Diagnosis not present

## 2019-11-04 DIAGNOSIS — I10 Essential (primary) hypertension: Secondary | ICD-10-CM | POA: Diagnosis not present

## 2019-11-04 DIAGNOSIS — Z955 Presence of coronary angioplasty implant and graft: Secondary | ICD-10-CM | POA: Diagnosis not present

## 2019-11-14 DIAGNOSIS — B353 Tinea pedis: Secondary | ICD-10-CM | POA: Diagnosis not present

## 2019-11-14 DIAGNOSIS — E1165 Type 2 diabetes mellitus with hyperglycemia: Secondary | ICD-10-CM | POA: Diagnosis not present

## 2019-11-14 DIAGNOSIS — E1142 Type 2 diabetes mellitus with diabetic polyneuropathy: Secondary | ICD-10-CM | POA: Diagnosis not present

## 2019-11-14 DIAGNOSIS — R600 Localized edema: Secondary | ICD-10-CM | POA: Diagnosis not present

## 2019-11-19 DIAGNOSIS — I351 Nonrheumatic aortic (valve) insufficiency: Secondary | ICD-10-CM | POA: Diagnosis not present

## 2019-11-19 DIAGNOSIS — I2510 Atherosclerotic heart disease of native coronary artery without angina pectoris: Secondary | ICD-10-CM | POA: Diagnosis not present

## 2019-11-19 DIAGNOSIS — R0789 Other chest pain: Secondary | ICD-10-CM | POA: Diagnosis not present

## 2019-12-02 DIAGNOSIS — M4802 Spinal stenosis, cervical region: Secondary | ICD-10-CM | POA: Diagnosis not present

## 2019-12-13 DIAGNOSIS — Z20828 Contact with and (suspected) exposure to other viral communicable diseases: Secondary | ICD-10-CM | POA: Diagnosis not present

## 2019-12-13 DIAGNOSIS — R051 Acute cough: Secondary | ICD-10-CM | POA: Diagnosis not present

## 2019-12-13 DIAGNOSIS — J449 Chronic obstructive pulmonary disease, unspecified: Secondary | ICD-10-CM | POA: Diagnosis not present

## 2019-12-18 DIAGNOSIS — Z23 Encounter for immunization: Secondary | ICD-10-CM | POA: Diagnosis not present

## 2019-12-24 DIAGNOSIS — M48061 Spinal stenosis, lumbar region without neurogenic claudication: Secondary | ICD-10-CM | POA: Diagnosis not present

## 2019-12-24 DIAGNOSIS — M4802 Spinal stenosis, cervical region: Secondary | ICD-10-CM | POA: Diagnosis not present

## 2019-12-24 DIAGNOSIS — M5136 Other intervertebral disc degeneration, lumbar region: Secondary | ICD-10-CM | POA: Diagnosis not present

## 2019-12-26 DIAGNOSIS — M542 Cervicalgia: Secondary | ICD-10-CM | POA: Diagnosis not present

## 2019-12-26 DIAGNOSIS — Z981 Arthrodesis status: Secondary | ICD-10-CM | POA: Diagnosis not present

## 2019-12-26 DIAGNOSIS — G8929 Other chronic pain: Secondary | ICD-10-CM | POA: Diagnosis not present

## 2019-12-26 DIAGNOSIS — M2578 Osteophyte, vertebrae: Secondary | ICD-10-CM | POA: Diagnosis not present

## 2020-01-24 DIAGNOSIS — J019 Acute sinusitis, unspecified: Secondary | ICD-10-CM | POA: Diagnosis not present

## 2020-01-27 DIAGNOSIS — M4802 Spinal stenosis, cervical region: Secondary | ICD-10-CM | POA: Diagnosis not present

## 2020-02-03 DIAGNOSIS — I2510 Atherosclerotic heart disease of native coronary artery without angina pectoris: Secondary | ICD-10-CM | POA: Diagnosis not present

## 2020-02-03 DIAGNOSIS — R002 Palpitations: Secondary | ICD-10-CM | POA: Diagnosis not present

## 2020-02-03 DIAGNOSIS — E785 Hyperlipidemia, unspecified: Secondary | ICD-10-CM | POA: Diagnosis not present

## 2020-02-03 DIAGNOSIS — R0789 Other chest pain: Secondary | ICD-10-CM | POA: Diagnosis not present

## 2020-02-03 DIAGNOSIS — Z955 Presence of coronary angioplasty implant and graft: Secondary | ICD-10-CM | POA: Diagnosis not present

## 2020-02-03 DIAGNOSIS — I351 Nonrheumatic aortic (valve) insufficiency: Secondary | ICD-10-CM | POA: Diagnosis not present

## 2020-02-03 DIAGNOSIS — I10 Essential (primary) hypertension: Secondary | ICD-10-CM | POA: Diagnosis not present

## 2020-06-22 DIAGNOSIS — M48062 Spinal stenosis, lumbar region with neurogenic claudication: Secondary | ICD-10-CM | POA: Diagnosis not present

## 2020-07-02 DIAGNOSIS — J9611 Chronic respiratory failure with hypoxia: Secondary | ICD-10-CM | POA: Diagnosis not present

## 2020-07-09 DIAGNOSIS — F1120 Opioid dependence, uncomplicated: Secondary | ICD-10-CM | POA: Diagnosis not present

## 2020-07-09 DIAGNOSIS — M48061 Spinal stenosis, lumbar region without neurogenic claudication: Secondary | ICD-10-CM | POA: Diagnosis not present

## 2020-07-09 DIAGNOSIS — M5136 Other intervertebral disc degeneration, lumbar region: Secondary | ICD-10-CM | POA: Diagnosis not present

## 2020-08-02 DIAGNOSIS — J9611 Chronic respiratory failure with hypoxia: Secondary | ICD-10-CM | POA: Diagnosis not present

## 2020-09-01 DIAGNOSIS — J9611 Chronic respiratory failure with hypoxia: Secondary | ICD-10-CM | POA: Diagnosis not present

## 2020-09-03 DIAGNOSIS — J449 Chronic obstructive pulmonary disease, unspecified: Secondary | ICD-10-CM | POA: Diagnosis not present

## 2020-09-03 DIAGNOSIS — R600 Localized edema: Secondary | ICD-10-CM | POA: Diagnosis not present

## 2020-09-03 DIAGNOSIS — Z6834 Body mass index (BMI) 34.0-34.9, adult: Secondary | ICD-10-CM | POA: Diagnosis not present

## 2020-09-03 DIAGNOSIS — M5137 Other intervertebral disc degeneration, lumbosacral region: Secondary | ICD-10-CM | POA: Diagnosis not present

## 2020-09-03 DIAGNOSIS — F17210 Nicotine dependence, cigarettes, uncomplicated: Secondary | ICD-10-CM | POA: Diagnosis not present

## 2020-09-03 DIAGNOSIS — B353 Tinea pedis: Secondary | ICD-10-CM | POA: Diagnosis not present

## 2020-09-03 DIAGNOSIS — J9611 Chronic respiratory failure with hypoxia: Secondary | ICD-10-CM | POA: Diagnosis not present

## 2020-09-03 DIAGNOSIS — S76212A Strain of adductor muscle, fascia and tendon of left thigh, initial encounter: Secondary | ICD-10-CM | POA: Diagnosis not present

## 2020-09-03 DIAGNOSIS — Z9981 Dependence on supplemental oxygen: Secondary | ICD-10-CM | POA: Diagnosis not present

## 2020-09-14 DIAGNOSIS — Z955 Presence of coronary angioplasty implant and graft: Secondary | ICD-10-CM | POA: Diagnosis not present

## 2020-09-14 DIAGNOSIS — I10 Essential (primary) hypertension: Secondary | ICD-10-CM | POA: Diagnosis not present

## 2020-09-14 DIAGNOSIS — I351 Nonrheumatic aortic (valve) insufficiency: Secondary | ICD-10-CM | POA: Diagnosis not present

## 2020-09-14 DIAGNOSIS — E785 Hyperlipidemia, unspecified: Secondary | ICD-10-CM | POA: Diagnosis not present

## 2020-09-14 DIAGNOSIS — I2510 Atherosclerotic heart disease of native coronary artery without angina pectoris: Secondary | ICD-10-CM | POA: Diagnosis not present

## 2020-09-23 DIAGNOSIS — Z6834 Body mass index (BMI) 34.0-34.9, adult: Secondary | ICD-10-CM | POA: Diagnosis not present

## 2020-09-23 DIAGNOSIS — E1165 Type 2 diabetes mellitus with hyperglycemia: Secondary | ICD-10-CM | POA: Diagnosis not present

## 2020-09-23 DIAGNOSIS — S99921A Unspecified injury of right foot, initial encounter: Secondary | ICD-10-CM | POA: Diagnosis not present

## 2020-09-23 DIAGNOSIS — E1142 Type 2 diabetes mellitus with diabetic polyneuropathy: Secondary | ICD-10-CM | POA: Diagnosis not present

## 2020-09-24 ENCOUNTER — Emergency Department (HOSPITAL_COMMUNITY): Payer: Medicare PPO

## 2020-09-24 ENCOUNTER — Other Ambulatory Visit: Payer: Self-pay

## 2020-09-24 ENCOUNTER — Encounter (HOSPITAL_COMMUNITY): Payer: Self-pay

## 2020-09-24 ENCOUNTER — Emergency Department (HOSPITAL_COMMUNITY)
Admission: EM | Admit: 2020-09-24 | Discharge: 2020-09-24 | Disposition: A | Discharge status: Home / Self Care | Payer: Medicare PPO | Attending: Emergency Medicine | Admitting: Emergency Medicine

## 2020-09-24 DIAGNOSIS — Z87891 Personal history of nicotine dependence: Secondary | ICD-10-CM | POA: Diagnosis not present

## 2020-09-24 DIAGNOSIS — Z7984 Long term (current) use of oral hypoglycemic drugs: Secondary | ICD-10-CM | POA: Diagnosis not present

## 2020-09-24 DIAGNOSIS — M7989 Other specified soft tissue disorders: Secondary | ICD-10-CM | POA: Diagnosis not present

## 2020-09-24 DIAGNOSIS — S92424B Nondisplaced fracture of distal phalanx of right great toe, initial encounter for open fracture: Secondary | ICD-10-CM | POA: Insufficient documentation

## 2020-09-24 DIAGNOSIS — J449 Chronic obstructive pulmonary disease, unspecified: Secondary | ICD-10-CM | POA: Diagnosis not present

## 2020-09-24 DIAGNOSIS — S99921A Unspecified injury of right foot, initial encounter: Secondary | ICD-10-CM | POA: Diagnosis present

## 2020-09-24 DIAGNOSIS — I2510 Atherosclerotic heart disease of native coronary artery without angina pectoris: Secondary | ICD-10-CM | POA: Insufficient documentation

## 2020-09-24 DIAGNOSIS — Z91040 Latex allergy status: Secondary | ICD-10-CM | POA: Insufficient documentation

## 2020-09-24 DIAGNOSIS — Z7982 Long term (current) use of aspirin: Secondary | ICD-10-CM | POA: Insufficient documentation

## 2020-09-24 DIAGNOSIS — Z79899 Other long term (current) drug therapy: Secondary | ICD-10-CM | POA: Insufficient documentation

## 2020-09-24 DIAGNOSIS — W208XXA Other cause of strike by thrown, projected or falling object, initial encounter: Secondary | ICD-10-CM | POA: Diagnosis not present

## 2020-09-24 DIAGNOSIS — I10 Essential (primary) hypertension: Secondary | ICD-10-CM | POA: Insufficient documentation

## 2020-09-24 DIAGNOSIS — Z7951 Long term (current) use of inhaled steroids: Secondary | ICD-10-CM | POA: Diagnosis not present

## 2020-09-24 DIAGNOSIS — E119 Type 2 diabetes mellitus without complications: Secondary | ICD-10-CM | POA: Insufficient documentation

## 2020-09-24 MED ORDER — CEPHALEXIN 250 MG PO CAPS: 500.0000 mg | ORAL_CAPSULE | Freq: Once | ORAL | Status: DC

## 2020-09-24 MED ORDER — BACITRACIN ZINC 500 UNIT/GM EX OINT
TOPICAL_OINTMENT | Freq: Once | CUTANEOUS | Status: AC
  Administered 2020-09-24: 1 via TOPICAL
  Filled 2020-09-24: qty 0.9

## 2020-09-24 NOTE — ED Notes (Signed)
Pt has 2+ swelling of right 1st digit of foot, erythematous, has dried blood on it. Toe nail is ecchymotic. Pt has 2+ right pedal pulse, foot warm to touch.

## 2020-09-24 NOTE — ED Provider Notes (Signed)
[facility] EMERGENCY DEPARTMENT Provider Note   CSN: [id number] Arrival date & time: 09/24/20  1143     History Chief Complaint  Patient presents with   Foot Injury    Nathan Franco is a 60 y.o. male with a PMH of DM2, history of smoking, and COPD who presents to the department after having dropped a TV on his R great toe. He was seen by his PCP yesterday to evaluate the wound and was prescribed Keflex and Oxycodone. Xray performed at the time of evaluation and confirmed a fracture of the great toe. Patient was sent to the emergency department for further evaluation due to open nature of injury and history of diabetes for orthopedic consultation. Patient reports his diabetes is well controlled with medication, and that he is not currently smoking cigarettes. Patient reports acute pain to the toe, but it has been manageable with pain medication.   Foot Injury     Past Medical History:  Diagnosis Date   Anxiety    Constipation    COPD (chronic obstructive pulmonary disease) (HCC)    Coronary artery disease    Diabetes mellitus without complication (HCC)    Type II   Dyspnea    Neuropathy    feet anad hands   Pneumonia     Patient Active Problem List   Diagnosis Date Noted   Cervical myelopathy (HCC) 01/15/2018   Coronary artery disease involving native coronary artery of native heart without angina pectoris 10/14/2015   Dyslipidemia 10/14/2015   Essential hypertension 10/14/2015   Smoking 10/14/2015   Angina pectoris (HCC) 09/26/2015    Past Surgical History:  Procedure Laterality Date   ANTERIOR CERVICAL DECOMP/DISCECTOMY FUSION N/A 01/15/2018   Procedure: ANTERIOR CERVICAL DECOMPRESSION/DISCECTOMY FUSION, INTERBODY PROSTHESIS, PLATE/SCREWS CERVICAL THREE- CERVICAL FOUR;  Surgeon: Tressie Stalker, MD;  Location: [facility] OR;  Service: Neurosurgery;  Laterality: N/A;  ANTERIOR   CARDIAC CATHETERIZATION  09/2015   stent   CIRCUMCISION     COLONOSCOPY  W/ POLYPECTOMY     KNEE ARTHROSCOPY Left    ACL   VASECTOMY         No family history on file.  Social History   Tobacco Use   Smoking status: Former    Years: 25.00    Types: Cigarettes    Quit date: 2017    Years since quitting: 5.6   Smokeless tobacco: Never  Vaping Use   Vaping Use: Never used  Substance Use Topics   Alcohol use: Not Currently   Drug use: Never    Home Medications Prior to Admission medications   Medication Sig Start Date End Date Taking? Authorizing Provider  albuterol (PROVENTIL HFA;VENTOLIN HFA) 108 (90 Base) MCG/ACT inhaler Inhale 2 puffs into the lungs every 6 (six) hours as needed for wheezing or shortness of breath.     [provider]  ALPRAZolam Prudy Feeler) 0.5 MG tablet Take 0.5 mg by mouth 3 (three) times daily as needed (for panic/anxiety attacks.).  12/30/15   [provider]  aspirin EC 81 MG tablet Take 81 mg by mouth daily.  09/20/16   [provider]  azelastine (ASTELIN) 0.1 % nasal spray Place 1 spray into both nostrils 2 (two) times daily as needed (cold/flu-like symptoms).     [provider]  fluticasone furoate-vilanterol (BREO ELLIPTA) 100-25 MCG/INH AEPB Inhale 1 puff into the lungs daily.     [provider]  gabapentin (NEURONTIN) 400 MG capsule Take 400 mg by mouth 4 (four)  times daily.     [provider]  hydrOXYzine (ATARAX/VISTARIL) 25 MG tablet Take 25 mg by mouth daily.    [provider]  ipratropium-albuterol (DUONEB) 0.5-2.5 (3) MG/3ML SOLN 3 mLs every 6 (six) hours as needed (wheezing/shortness of breath).  06/23/16   [provider]  JANUVIA 100 MG tablet Take 100 mg by mouth daily. 11/27/17   [provider]  labetalol (NORMODYNE) 200 MG tablet Take 200 mg by mouth 2 (two) times daily.    [provider]  lubiprostone (AMITIZA) 24 MCG capsule Take 24 mcg by mouth 3 (three) times daily after meals.     [provider]   methocarbamol (ROBAXIN) 500 MG tablet Take 500 mg by mouth 2 (two) times daily.     [provider]  montelukast (SINGULAIR) 10 MG tablet Take 10 mg by mouth at bedtime.     [provider]  olmesartan-hydrochlorothiazide (BENICAR HCT) 40-25 MG tablet Take 1 tablet by mouth daily.    [provider]  oxyCODONE (OXY IR/ROXICODONE) 5 MG immediate release tablet Take 1 tablet (5 mg total) by mouth every 4 (four) hours as needed for moderate pain ((score 4 to 6)). 01/16/18   Tressie Stalker, MD  rosuvastatin (CRESTOR) 20 MG tablet Take 20 mg by mouth daily.     [provider]  topiramate (TOPAMAX) 50 MG tablet Take 50 mg by mouth 2 (two) times daily.     [provider]  umeclidinium bromide (INCRUSE ELLIPTA) 62.5 MCG/INH AEPB Inhale 1 puff into the lungs daily.    [provider]    Allergies    Latex  Review of Systems   Review of Systems  Skin:  Positive for wound.  Neurological:  Negative for weakness and numbness.  All other systems reviewed and are negative.  Physical Exam Updated Vital Signs BP (!) 199/78   Pulse 89   Temp 98 F (36.7 C)   Resp 16   Ht 6\' 2"  (1.88 m)   Wt 122.5 kg   SpO2 100%   BMI 34.67 kg/m   Physical Exam Vitals and nursing note reviewed.  Constitutional:      Appearance: Normal appearance.  HENT:     Head: Normocephalic and atraumatic.  Eyes:     General:        Right eye: No discharge.        Left eye: No discharge.  Cardiovascular:     Rate and Rhythm: Normal rate and regular rhythm.     Pulses: Normal pulses.     Heart sounds: Normal heart sounds.     Comments: DP and PT pulses 2+ in BIL feet Pulmonary:     Effort: Pulmonary effort is normal.     Breath sounds: Normal breath sounds.  Abdominal:     General: Abdomen is flat.     Palpations: Abdomen is soft.  Skin:    General: Skin is warm and dry.     Comments: Subungual hematoma of right great toe with split nail bed that extends  to just above the nail matrix. Epinychial bleeding in the area just proximal to nail bed. Swelling and redness without excessive tenderness and warmth of right great toe.  Neurological:     Mental Status: He is alert.    ED Results / Procedures / Treatments   Labs (all labs ordered are listed, but only abnormal results are displayed) Labs Reviewed - No data to display  EKG None  Radiology DG  Foot Complete Right  Result Date: 09/24/2020 CLINICAL DATA:  Trauma EXAM: RIGHT FOOT COMPLETE - 3+ VIEW COMPARISON:  None. FINDINGS: Multiple fracture lines of the great toe phalanx with no significant displacement. Soft tissue swelling of the great toe. Mild degenerative changes of the mid foot and first MTP joint. Serpiginous sclerotic lesions of the calcaneus and distal tibia, likely due to prior bone infarcts. IMPRESSION: Nondisplaced fracture of the right great toe phalanx. Electronically Signed   By: Allegra Lai MD   On: 09/24/2020 13:28    Procedures Procedures   Medications Ordered in ED Medications  bacitracin ointment (1 application Topical Given 09/24/20 1549)    ED Course  I have reviewed the triage vital signs and the nursing notes.  Pertinent labs & imaging results that were available during my care of the patient were reviewed by me and considered in my medical decision making (see chart for details).    MDM Rules/Calculators/A&P                         Wound meets criteria for open fracture given bleeding epinychial skin tissue above the confirmed fracture. X-Ray Showed: "Nondisplaced fracture of the right great toe phalanx". No evidence of infection, excessive pain, or warmth in the area of injury. Discussed plan of care with orthopedic consult who agrees with plan to apply antibiotic ointment, place patient in a walking boot, and continue prescription of oral Keflex. Patient has oxycodone for pain control. Recommend he alternate tylenol and ibuprofen additionally. Patient  to follow up with orthopedics for follow up early next week. Patient instructed to come in for re-evaluation if right great toe shows new onset signs of infection including excessive pain, heat, or new purulence. Final Clinical Impression(s) / ED Diagnoses Final diagnoses:  None    Rx / DC Orders ED Discharge Orders     None        [place] 09/24/20 1649    Eber Hong, MD 09/26/20 1221

## 2020-09-24 NOTE — ED Provider Notes (Signed)
Emergency Medicine Provider Triage Evaluation Note  Nathan Franco , a 60 y.o. male  was evaluated in triage.  Pt complains of right great toe pain.  Patient states that yesterday he accidentally dropped a TV on his foot.  Patient complains of pain to right great toe.  Patient describes pain as throbbing.  Patient endorses continued bleeding from right great toe.  Patient denies any numbness or weakness.  Review of Systems  Positive: Arthralgia, myalgia Negative: Numbness, weakness  Physical Exam  BP (!) 93/56   Pulse 89   Temp 98.5 F (36.9 C) (Oral)   Resp 17   Ht 6\' 2"  (1.88 m)   Wt 122.5 kg   SpO2 96%   BMI 34.67 kg/m  Gen:   Awake, no distress   Resp:  Normal effort  MSK:   Moves extremities without difficulty, full range of motion to all digits of right foot.  Cap refill less than 2 seconds in all digits of right foot.  +2 right DP pulse.  Full range of motion to right ankle. Other:    Medical Decision Making  Medically screening exam initiated at 12:33 PM.  Appropriate orders placed.  was informed that the remainder of the evaluation will be completed by another provider, this initial triage assessment does not replace that evaluation, and the importance of remaining in the ED until their evaluation is complete.  The patient appears stable so that the remainder of the work up may be completed by another provider.      Nathan Franco 09/24/20 1234    11/24/20, MD 09/24/20 [phone]

## 2020-09-24 NOTE — ED Triage Notes (Signed)
Pt states that yesterday he injured R greater toe by dropping TV on his foot. Was seen by PCP and told he needed to come to ED for evaluation due to his hx of diabetes and fx toe.

## 2020-09-24 NOTE — Discharge Instructions (Addendum)
Continue to take pain medication as discussed, alternating ibuprofen and tylenol every 3 hours. Take oxycodone prescription for breakthrough pain as needed. Continue to take Keflex antibiotic 4x daily as prescribed until the prescription is finished. I have attached contact information for an orthopedist in [place], Dr. Renaye Rakers, please call to schedule an appointment with his office for early next week.

## 2020-09-24 NOTE — ED Notes (Signed)
Cleaned pt's toe with NS and applied a dry dressing

## 2020-09-24 NOTE — ED Provider Notes (Signed)
Patient presents to injury of great toe, has underlying fracture on imaging which I personally seen and evaluated.  Patient will need consult with orthopedics for follow-up, this is technically an open fracture given that there is damage to the nail and some bleeding above it, there is no exposed bone  Consult with ortho for management and f/u Keflex PO -  Sterile dressing  I discussed the case with [place] of the orthopedic team who recommends the patient be in a postop shoe, sterile dressing, continue cephalexin, continue pain control, follow-up with Dr. Eulah Pont early next week.  Medical screening examination/treatment/procedure(s) were conducted as a shared visit with non-physician practitioner(s) and myself.  I personally evaluated the patient during the encounter.  Clinical Impression:   Final diagnoses:  None         Eber Hong, MD 09/26/20 1221

## 2020-09-30 DIAGNOSIS — M79674 Pain in right toe(s): Secondary | ICD-10-CM | POA: Diagnosis not present

## 2020-10-02 DIAGNOSIS — J9611 Chronic respiratory failure with hypoxia: Secondary | ICD-10-CM | POA: Diagnosis not present

## 2020-10-05 DIAGNOSIS — J449 Chronic obstructive pulmonary disease, unspecified: Secondary | ICD-10-CM | POA: Diagnosis not present

## 2020-10-05 DIAGNOSIS — G8929 Other chronic pain: Secondary | ICD-10-CM | POA: Diagnosis not present

## 2020-10-05 DIAGNOSIS — I10 Essential (primary) hypertension: Secondary | ICD-10-CM | POA: Diagnosis not present

## 2020-10-05 DIAGNOSIS — I2510 Atherosclerotic heart disease of native coronary artery without angina pectoris: Secondary | ICD-10-CM | POA: Diagnosis not present

## 2020-10-05 DIAGNOSIS — E1142 Type 2 diabetes mellitus with diabetic polyneuropathy: Secondary | ICD-10-CM | POA: Diagnosis not present

## 2020-10-05 DIAGNOSIS — F419 Anxiety disorder, unspecified: Secondary | ICD-10-CM | POA: Diagnosis not present

## 2020-10-05 DIAGNOSIS — G4733 Obstructive sleep apnea (adult) (pediatric): Secondary | ICD-10-CM | POA: Diagnosis not present

## 2020-10-05 DIAGNOSIS — E785 Hyperlipidemia, unspecified: Secondary | ICD-10-CM | POA: Diagnosis not present

## 2020-10-07 DIAGNOSIS — M79674 Pain in right toe(s): Secondary | ICD-10-CM | POA: Diagnosis not present

## 2020-10-08 DIAGNOSIS — M48061 Spinal stenosis, lumbar region without neurogenic claudication: Secondary | ICD-10-CM | POA: Diagnosis not present

## 2020-10-08 DIAGNOSIS — M48062 Spinal stenosis, lumbar region with neurogenic claudication: Secondary | ICD-10-CM | POA: Diagnosis not present

## 2020-10-08 DIAGNOSIS — M5136 Other intervertebral disc degeneration, lumbar region: Secondary | ICD-10-CM | POA: Diagnosis not present

## 2020-10-08 DIAGNOSIS — F1120 Opioid dependence, uncomplicated: Secondary | ICD-10-CM | POA: Diagnosis not present

## 2020-10-08 DIAGNOSIS — Z6835 Body mass index (BMI) 35.0-35.9, adult: Secondary | ICD-10-CM | POA: Diagnosis not present

## 2020-10-08 DIAGNOSIS — I10 Essential (primary) hypertension: Secondary | ICD-10-CM | POA: Diagnosis not present

## 2020-10-12 DIAGNOSIS — Z87891 Personal history of nicotine dependence: Secondary | ICD-10-CM | POA: Diagnosis not present

## 2020-10-12 DIAGNOSIS — F17210 Nicotine dependence, cigarettes, uncomplicated: Secondary | ICD-10-CM | POA: Diagnosis not present

## 2020-10-21 DIAGNOSIS — M79674 Pain in right toe(s): Secondary | ICD-10-CM | POA: Diagnosis not present

## 2020-10-26 DIAGNOSIS — E1169 Type 2 diabetes mellitus with other specified complication: Secondary | ICD-10-CM | POA: Diagnosis not present

## 2020-10-26 DIAGNOSIS — N401 Enlarged prostate with lower urinary tract symptoms: Secondary | ICD-10-CM | POA: Diagnosis not present

## 2020-10-26 DIAGNOSIS — N521 Erectile dysfunction due to diseases classified elsewhere: Secondary | ICD-10-CM | POA: Diagnosis not present

## 2020-10-26 DIAGNOSIS — N3941 Urge incontinence: Secondary | ICD-10-CM | POA: Diagnosis not present

## 2020-10-29 DIAGNOSIS — M48062 Spinal stenosis, lumbar region with neurogenic claudication: Secondary | ICD-10-CM | POA: Diagnosis not present

## 2020-11-02 DIAGNOSIS — J9611 Chronic respiratory failure with hypoxia: Secondary | ICD-10-CM | POA: Diagnosis not present

## 2020-11-04 DIAGNOSIS — I10 Essential (primary) hypertension: Secondary | ICD-10-CM | POA: Diagnosis not present

## 2020-11-04 DIAGNOSIS — F411 Generalized anxiety disorder: Secondary | ICD-10-CM | POA: Diagnosis not present

## 2020-11-04 DIAGNOSIS — E1142 Type 2 diabetes mellitus with diabetic polyneuropathy: Secondary | ICD-10-CM | POA: Diagnosis not present

## 2020-11-04 DIAGNOSIS — E785 Hyperlipidemia, unspecified: Secondary | ICD-10-CM | POA: Diagnosis not present

## 2020-11-04 DIAGNOSIS — M5137 Other intervertebral disc degeneration, lumbosacral region: Secondary | ICD-10-CM | POA: Diagnosis not present

## 2020-11-04 DIAGNOSIS — E1165 Type 2 diabetes mellitus with hyperglycemia: Secondary | ICD-10-CM | POA: Diagnosis not present

## 2020-11-04 DIAGNOSIS — G894 Chronic pain syndrome: Secondary | ICD-10-CM | POA: Diagnosis not present

## 2020-11-04 DIAGNOSIS — J449 Chronic obstructive pulmonary disease, unspecified: Secondary | ICD-10-CM | POA: Diagnosis not present

## 2020-11-04 DIAGNOSIS — K2960 Other gastritis without bleeding: Secondary | ICD-10-CM | POA: Diagnosis not present

## 2020-11-09 DIAGNOSIS — F17210 Nicotine dependence, cigarettes, uncomplicated: Secondary | ICD-10-CM | POA: Diagnosis not present

## 2020-11-09 DIAGNOSIS — G4733 Obstructive sleep apnea (adult) (pediatric): Secondary | ICD-10-CM | POA: Diagnosis not present

## 2020-11-09 DIAGNOSIS — J449 Chronic obstructive pulmonary disease, unspecified: Secondary | ICD-10-CM | POA: Diagnosis not present

## 2020-11-09 DIAGNOSIS — E669 Obesity, unspecified: Secondary | ICD-10-CM | POA: Diagnosis not present

## 2020-11-11 DIAGNOSIS — M79674 Pain in right toe(s): Secondary | ICD-10-CM | POA: Diagnosis not present

## 2020-11-18 DIAGNOSIS — I351 Nonrheumatic aortic (valve) insufficiency: Secondary | ICD-10-CM | POA: Diagnosis not present

## 2020-11-18 DIAGNOSIS — I2510 Atherosclerotic heart disease of native coronary artery without angina pectoris: Secondary | ICD-10-CM | POA: Diagnosis not present

## 2020-12-02 DIAGNOSIS — J9611 Chronic respiratory failure with hypoxia: Secondary | ICD-10-CM | POA: Diagnosis not present

## 2020-12-04 DIAGNOSIS — G4733 Obstructive sleep apnea (adult) (pediatric): Secondary | ICD-10-CM | POA: Diagnosis not present

## 2020-12-14 DIAGNOSIS — F1120 Opioid dependence, uncomplicated: Secondary | ICD-10-CM | POA: Diagnosis not present

## 2020-12-14 DIAGNOSIS — M48062 Spinal stenosis, lumbar region with neurogenic claudication: Secondary | ICD-10-CM | POA: Diagnosis not present

## 2021-01-18 DIAGNOSIS — S39012A Strain of muscle, fascia and tendon of lower back, initial encounter: Secondary | ICD-10-CM | POA: Diagnosis not present

## 2021-01-18 DIAGNOSIS — M5137 Other intervertebral disc degeneration, lumbosacral region: Secondary | ICD-10-CM | POA: Diagnosis not present

## 2021-01-18 DIAGNOSIS — S76212A Strain of adductor muscle, fascia and tendon of left thigh, initial encounter: Secondary | ICD-10-CM | POA: Diagnosis not present

## 2021-01-18 DIAGNOSIS — M7022 Olecranon bursitis, left elbow: Secondary | ICD-10-CM | POA: Diagnosis not present

## 2021-01-18 DIAGNOSIS — Z6834 Body mass index (BMI) 34.0-34.9, adult: Secondary | ICD-10-CM | POA: Diagnosis not present

## 2021-01-28 DIAGNOSIS — M48062 Spinal stenosis, lumbar region with neurogenic claudication: Secondary | ICD-10-CM | POA: Diagnosis not present

## 2021-02-22 DIAGNOSIS — G894 Chronic pain syndrome: Secondary | ICD-10-CM | POA: Diagnosis not present

## 2021-02-22 DIAGNOSIS — E1142 Type 2 diabetes mellitus with diabetic polyneuropathy: Secondary | ICD-10-CM | POA: Diagnosis not present

## 2021-02-22 DIAGNOSIS — Z9981 Dependence on supplemental oxygen: Secondary | ICD-10-CM | POA: Diagnosis not present

## 2021-02-22 DIAGNOSIS — J9611 Chronic respiratory failure with hypoxia: Secondary | ICD-10-CM | POA: Diagnosis not present

## 2021-02-22 DIAGNOSIS — J449 Chronic obstructive pulmonary disease, unspecified: Secondary | ICD-10-CM | POA: Diagnosis not present

## 2021-02-22 DIAGNOSIS — E785 Hyperlipidemia, unspecified: Secondary | ICD-10-CM | POA: Diagnosis not present

## 2021-02-22 DIAGNOSIS — I10 Essential (primary) hypertension: Secondary | ICD-10-CM | POA: Diagnosis not present

## 2021-02-22 DIAGNOSIS — F17210 Nicotine dependence, cigarettes, uncomplicated: Secondary | ICD-10-CM | POA: Diagnosis not present

## 2021-02-22 DIAGNOSIS — E1165 Type 2 diabetes mellitus with hyperglycemia: Secondary | ICD-10-CM | POA: Diagnosis not present

## 2021-03-08 DIAGNOSIS — F1120 Opioid dependence, uncomplicated: Secondary | ICD-10-CM | POA: Diagnosis not present

## 2021-03-08 DIAGNOSIS — M48062 Spinal stenosis, lumbar region with neurogenic claudication: Secondary | ICD-10-CM | POA: Diagnosis not present

## 2021-03-08 DIAGNOSIS — M25551 Pain in right hip: Secondary | ICD-10-CM | POA: Diagnosis not present

## 2021-03-17 DIAGNOSIS — E785 Hyperlipidemia, unspecified: Secondary | ICD-10-CM | POA: Diagnosis not present

## 2021-03-17 DIAGNOSIS — I2510 Atherosclerotic heart disease of native coronary artery without angina pectoris: Secondary | ICD-10-CM | POA: Diagnosis not present

## 2021-03-17 DIAGNOSIS — I351 Nonrheumatic aortic (valve) insufficiency: Secondary | ICD-10-CM | POA: Diagnosis not present

## 2021-03-17 DIAGNOSIS — Z955 Presence of coronary angioplasty implant and graft: Secondary | ICD-10-CM | POA: Diagnosis not present

## 2021-03-17 DIAGNOSIS — I10 Essential (primary) hypertension: Secondary | ICD-10-CM | POA: Diagnosis not present

## 2021-03-17 DIAGNOSIS — R55 Syncope and collapse: Secondary | ICD-10-CM | POA: Diagnosis not present

## 2021-03-18 DIAGNOSIS — M4800 Spinal stenosis, site unspecified: Secondary | ICD-10-CM | POA: Diagnosis not present

## 2021-03-18 DIAGNOSIS — E1136 Type 2 diabetes mellitus with diabetic cataract: Secondary | ICD-10-CM | POA: Diagnosis not present

## 2021-03-18 DIAGNOSIS — N400 Enlarged prostate without lower urinary tract symptoms: Secondary | ICD-10-CM | POA: Diagnosis not present

## 2021-03-18 DIAGNOSIS — E785 Hyperlipidemia, unspecified: Secondary | ICD-10-CM | POA: Diagnosis not present

## 2021-03-18 DIAGNOSIS — G8929 Other chronic pain: Secondary | ICD-10-CM | POA: Diagnosis not present

## 2021-03-18 DIAGNOSIS — E1142 Type 2 diabetes mellitus with diabetic polyneuropathy: Secondary | ICD-10-CM | POA: Diagnosis not present

## 2021-03-18 DIAGNOSIS — I10 Essential (primary) hypertension: Secondary | ICD-10-CM | POA: Diagnosis not present

## 2021-03-18 DIAGNOSIS — J449 Chronic obstructive pulmonary disease, unspecified: Secondary | ICD-10-CM | POA: Diagnosis not present

## 2021-03-18 DIAGNOSIS — F411 Generalized anxiety disorder: Secondary | ICD-10-CM | POA: Diagnosis not present

## 2021-03-18 DIAGNOSIS — I252 Old myocardial infarction: Secondary | ICD-10-CM | POA: Diagnosis not present

## 2021-03-18 DIAGNOSIS — N529 Male erectile dysfunction, unspecified: Secondary | ICD-10-CM | POA: Diagnosis not present

## 2021-03-18 DIAGNOSIS — I2510 Atherosclerotic heart disease of native coronary artery without angina pectoris: Secondary | ICD-10-CM | POA: Diagnosis not present

## 2021-03-18 DIAGNOSIS — N3941 Urge incontinence: Secondary | ICD-10-CM | POA: Diagnosis not present

## 2021-03-18 DIAGNOSIS — M1990 Unspecified osteoarthritis, unspecified site: Secondary | ICD-10-CM | POA: Diagnosis not present

## 2021-03-18 DIAGNOSIS — Z6833 Body mass index (BMI) 33.0-33.9, adult: Secondary | ICD-10-CM | POA: Diagnosis not present

## 2021-03-18 DIAGNOSIS — Z7951 Long term (current) use of inhaled steroids: Secondary | ICD-10-CM | POA: Diagnosis not present

## 2021-03-18 DIAGNOSIS — Z7409 Other reduced mobility: Secondary | ICD-10-CM | POA: Diagnosis not present

## 2021-03-18 DIAGNOSIS — E1165 Type 2 diabetes mellitus with hyperglycemia: Secondary | ICD-10-CM | POA: Diagnosis not present

## 2021-03-23 DIAGNOSIS — R55 Syncope and collapse: Secondary | ICD-10-CM | POA: Diagnosis not present

## 2021-03-23 DIAGNOSIS — I2510 Atherosclerotic heart disease of native coronary artery without angina pectoris: Secondary | ICD-10-CM | POA: Diagnosis not present

## 2021-04-12 DIAGNOSIS — R55 Syncope and collapse: Secondary | ICD-10-CM | POA: Diagnosis not present

## 2021-04-13 DIAGNOSIS — I471 Supraventricular tachycardia: Secondary | ICD-10-CM | POA: Diagnosis not present

## 2021-04-13 DIAGNOSIS — I491 Atrial premature depolarization: Secondary | ICD-10-CM | POA: Diagnosis not present

## 2021-04-13 DIAGNOSIS — I493 Ventricular premature depolarization: Secondary | ICD-10-CM | POA: Diagnosis not present

## 2021-04-29 DIAGNOSIS — M5416 Radiculopathy, lumbar region: Secondary | ICD-10-CM | POA: Diagnosis not present

## 2021-05-03 DIAGNOSIS — N401 Enlarged prostate with lower urinary tract symptoms: Secondary | ICD-10-CM | POA: Diagnosis not present

## 2021-05-03 DIAGNOSIS — N3941 Urge incontinence: Secondary | ICD-10-CM | POA: Diagnosis not present

## 2021-05-03 DIAGNOSIS — N521 Erectile dysfunction due to diseases classified elsewhere: Secondary | ICD-10-CM | POA: Diagnosis not present

## 2021-05-03 DIAGNOSIS — E1169 Type 2 diabetes mellitus with other specified complication: Secondary | ICD-10-CM | POA: Diagnosis not present

## 2021-05-24 DIAGNOSIS — E1142 Type 2 diabetes mellitus with diabetic polyneuropathy: Secondary | ICD-10-CM | POA: Diagnosis not present

## 2021-05-24 DIAGNOSIS — Z0000 Encounter for general adult medical examination without abnormal findings: Secondary | ICD-10-CM | POA: Diagnosis not present

## 2021-05-24 DIAGNOSIS — G894 Chronic pain syndrome: Secondary | ICD-10-CM | POA: Diagnosis not present

## 2021-05-24 DIAGNOSIS — E1165 Type 2 diabetes mellitus with hyperglycemia: Secondary | ICD-10-CM | POA: Diagnosis not present

## 2021-05-24 DIAGNOSIS — I10 Essential (primary) hypertension: Secondary | ICD-10-CM | POA: Diagnosis not present

## 2021-05-24 DIAGNOSIS — E785 Hyperlipidemia, unspecified: Secondary | ICD-10-CM | POA: Diagnosis not present

## 2021-05-24 DIAGNOSIS — F17210 Nicotine dependence, cigarettes, uncomplicated: Secondary | ICD-10-CM | POA: Diagnosis not present

## 2021-05-24 DIAGNOSIS — M65341 Trigger finger, right ring finger: Secondary | ICD-10-CM | POA: Diagnosis not present

## 2021-05-24 DIAGNOSIS — J449 Chronic obstructive pulmonary disease, unspecified: Secondary | ICD-10-CM | POA: Diagnosis not present

## 2021-05-25 DIAGNOSIS — M48062 Spinal stenosis, lumbar region with neurogenic claudication: Secondary | ICD-10-CM | POA: Diagnosis not present

## 2021-05-25 DIAGNOSIS — F1120 Opioid dependence, uncomplicated: Secondary | ICD-10-CM | POA: Diagnosis not present

## 2021-05-25 DIAGNOSIS — M5416 Radiculopathy, lumbar region: Secondary | ICD-10-CM | POA: Diagnosis not present

## 2021-05-25 DIAGNOSIS — E538 Deficiency of other specified B group vitamins: Secondary | ICD-10-CM | POA: Diagnosis not present

## 2021-05-27 DIAGNOSIS — F17200 Nicotine dependence, unspecified, uncomplicated: Secondary | ICD-10-CM | POA: Diagnosis not present

## 2021-05-27 DIAGNOSIS — K5909 Other constipation: Secondary | ICD-10-CM | POA: Diagnosis not present

## 2021-05-27 DIAGNOSIS — E669 Obesity, unspecified: Secondary | ICD-10-CM | POA: Diagnosis not present

## 2021-05-27 DIAGNOSIS — J449 Chronic obstructive pulmonary disease, unspecified: Secondary | ICD-10-CM | POA: Diagnosis not present

## 2021-05-27 DIAGNOSIS — F17210 Nicotine dependence, cigarettes, uncomplicated: Secondary | ICD-10-CM | POA: Diagnosis not present

## 2021-05-27 DIAGNOSIS — D126 Benign neoplasm of colon, unspecified: Secondary | ICD-10-CM | POA: Diagnosis not present

## 2021-05-27 DIAGNOSIS — G4733 Obstructive sleep apnea (adult) (pediatric): Secondary | ICD-10-CM | POA: Diagnosis not present

## 2021-06-02 DIAGNOSIS — I351 Nonrheumatic aortic (valve) insufficiency: Secondary | ICD-10-CM | POA: Diagnosis not present

## 2021-06-02 DIAGNOSIS — Z955 Presence of coronary angioplasty implant and graft: Secondary | ICD-10-CM | POA: Diagnosis not present

## 2021-06-02 DIAGNOSIS — R55 Syncope and collapse: Secondary | ICD-10-CM | POA: Diagnosis not present

## 2021-06-02 DIAGNOSIS — I2510 Atherosclerotic heart disease of native coronary artery without angina pectoris: Secondary | ICD-10-CM | POA: Diagnosis not present

## 2021-06-02 DIAGNOSIS — I10 Essential (primary) hypertension: Secondary | ICD-10-CM | POA: Diagnosis not present

## 2021-06-02 DIAGNOSIS — E785 Hyperlipidemia, unspecified: Secondary | ICD-10-CM | POA: Diagnosis not present

## 2021-07-29 DIAGNOSIS — D125 Benign neoplasm of sigmoid colon: Secondary | ICD-10-CM | POA: Diagnosis not present

## 2021-07-29 DIAGNOSIS — D126 Benign neoplasm of colon, unspecified: Secondary | ICD-10-CM | POA: Diagnosis not present

## 2021-07-29 DIAGNOSIS — Z86010 Personal history of colonic polyps: Secondary | ICD-10-CM | POA: Diagnosis not present

## 2021-07-29 DIAGNOSIS — K635 Polyp of colon: Secondary | ICD-10-CM | POA: Diagnosis not present

## 2021-07-29 DIAGNOSIS — Z1211 Encounter for screening for malignant neoplasm of colon: Secondary | ICD-10-CM | POA: Diagnosis not present

## 2021-07-29 DIAGNOSIS — D128 Benign neoplasm of rectum: Secondary | ICD-10-CM | POA: Diagnosis not present

## 2021-08-02 DIAGNOSIS — M5416 Radiculopathy, lumbar region: Secondary | ICD-10-CM | POA: Diagnosis not present

## 2021-08-23 DIAGNOSIS — M25462 Effusion, left knee: Secondary | ICD-10-CM | POA: Diagnosis not present

## 2021-08-23 DIAGNOSIS — M1712 Unilateral primary osteoarthritis, left knee: Secondary | ICD-10-CM | POA: Diagnosis not present

## 2021-09-06 DIAGNOSIS — M5416 Radiculopathy, lumbar region: Secondary | ICD-10-CM | POA: Diagnosis not present

## 2021-09-06 DIAGNOSIS — M48062 Spinal stenosis, lumbar region with neurogenic claudication: Secondary | ICD-10-CM | POA: Diagnosis not present

## 2021-09-06 DIAGNOSIS — F1120 Opioid dependence, uncomplicated: Secondary | ICD-10-CM | POA: Diagnosis not present

## 2021-09-21 DIAGNOSIS — Z113 Encounter for screening for infections with a predominantly sexual mode of transmission: Secondary | ICD-10-CM | POA: Diagnosis not present

## 2021-09-21 DIAGNOSIS — L309 Dermatitis, unspecified: Secondary | ICD-10-CM | POA: Diagnosis not present

## 2021-09-21 DIAGNOSIS — L209 Atopic dermatitis, unspecified: Secondary | ICD-10-CM | POA: Diagnosis not present

## 2021-09-23 ENCOUNTER — Encounter: Payer: Self-pay | Admitting: Cardiology

## 2021-09-23 DIAGNOSIS — J984 Other disorders of lung: Secondary | ICD-10-CM | POA: Diagnosis not present

## 2021-09-23 DIAGNOSIS — Z01818 Encounter for other preprocedural examination: Secondary | ICD-10-CM | POA: Diagnosis not present

## 2021-09-23 DIAGNOSIS — Z79899 Other long term (current) drug therapy: Secondary | ICD-10-CM | POA: Diagnosis not present

## 2021-09-23 DIAGNOSIS — R52 Pain, unspecified: Secondary | ICD-10-CM | POA: Diagnosis not present

## 2021-09-23 DIAGNOSIS — M79609 Pain in unspecified limb: Secondary | ICD-10-CM | POA: Diagnosis not present

## 2021-09-23 DIAGNOSIS — E559 Vitamin D deficiency, unspecified: Secondary | ICD-10-CM | POA: Diagnosis not present

## 2021-10-13 DIAGNOSIS — M1712 Unilateral primary osteoarthritis, left knee: Secondary | ICD-10-CM | POA: Diagnosis not present

## 2021-10-14 DIAGNOSIS — Z87891 Personal history of nicotine dependence: Secondary | ICD-10-CM | POA: Diagnosis not present

## 2021-10-14 DIAGNOSIS — F17210 Nicotine dependence, cigarettes, uncomplicated: Secondary | ICD-10-CM | POA: Diagnosis not present

## 2021-10-15 DIAGNOSIS — E119 Type 2 diabetes mellitus without complications: Secondary | ICD-10-CM | POA: Diagnosis not present

## 2021-10-21 DIAGNOSIS — J45909 Unspecified asthma, uncomplicated: Secondary | ICD-10-CM | POA: Diagnosis not present

## 2021-10-21 DIAGNOSIS — E119 Type 2 diabetes mellitus without complications: Secondary | ICD-10-CM | POA: Diagnosis not present

## 2021-10-21 DIAGNOSIS — M1612 Unilateral primary osteoarthritis, left hip: Secondary | ICD-10-CM | POA: Diagnosis not present

## 2021-10-21 DIAGNOSIS — Z9889 Other specified postprocedural states: Secondary | ICD-10-CM | POA: Diagnosis not present

## 2021-10-21 DIAGNOSIS — Z471 Aftercare following joint replacement surgery: Secondary | ICD-10-CM | POA: Diagnosis not present

## 2021-10-21 DIAGNOSIS — J9611 Chronic respiratory failure with hypoxia: Secondary | ICD-10-CM | POA: Diagnosis not present

## 2021-10-21 DIAGNOSIS — I10 Essential (primary) hypertension: Secondary | ICD-10-CM | POA: Diagnosis not present

## 2021-10-21 DIAGNOSIS — T8484XA Pain due to internal orthopedic prosthetic devices, implants and grafts, initial encounter: Secondary | ICD-10-CM | POA: Diagnosis not present

## 2021-10-21 DIAGNOSIS — Z96642 Presence of left artificial hip joint: Secondary | ICD-10-CM | POA: Diagnosis not present

## 2021-10-21 DIAGNOSIS — Z7984 Long term (current) use of oral hypoglycemic drugs: Secondary | ICD-10-CM | POA: Diagnosis not present

## 2021-10-21 DIAGNOSIS — Z96652 Presence of left artificial knee joint: Secondary | ICD-10-CM | POA: Diagnosis not present

## 2021-10-21 DIAGNOSIS — F17210 Nicotine dependence, cigarettes, uncomplicated: Secondary | ICD-10-CM | POA: Diagnosis not present

## 2021-10-21 DIAGNOSIS — M1712 Unilateral primary osteoarthritis, left knee: Secondary | ICD-10-CM | POA: Diagnosis not present

## 2021-10-21 DIAGNOSIS — R2689 Other abnormalities of gait and mobility: Secondary | ICD-10-CM | POA: Diagnosis not present

## 2021-10-21 DIAGNOSIS — G8918 Other acute postprocedural pain: Secondary | ICD-10-CM | POA: Diagnosis not present

## 2021-10-21 DIAGNOSIS — Z972 Presence of dental prosthetic device (complete) (partial): Secondary | ICD-10-CM | POA: Diagnosis not present

## 2021-10-22 DIAGNOSIS — F17210 Nicotine dependence, cigarettes, uncomplicated: Secondary | ICD-10-CM | POA: Diagnosis not present

## 2021-10-22 DIAGNOSIS — J45909 Unspecified asthma, uncomplicated: Secondary | ICD-10-CM | POA: Diagnosis not present

## 2021-10-22 DIAGNOSIS — R2689 Other abnormalities of gait and mobility: Secondary | ICD-10-CM | POA: Diagnosis not present

## 2021-10-22 DIAGNOSIS — Z7984 Long term (current) use of oral hypoglycemic drugs: Secondary | ICD-10-CM | POA: Diagnosis not present

## 2021-10-22 DIAGNOSIS — E119 Type 2 diabetes mellitus without complications: Secondary | ICD-10-CM | POA: Diagnosis not present

## 2021-10-22 DIAGNOSIS — M1712 Unilateral primary osteoarthritis, left knee: Secondary | ICD-10-CM | POA: Diagnosis not present

## 2021-10-22 DIAGNOSIS — G8918 Other acute postprocedural pain: Secondary | ICD-10-CM | POA: Diagnosis not present

## 2021-10-22 DIAGNOSIS — J9611 Chronic respiratory failure with hypoxia: Secondary | ICD-10-CM | POA: Diagnosis not present

## 2021-10-22 DIAGNOSIS — T8484XA Pain due to internal orthopedic prosthetic devices, implants and grafts, initial encounter: Secondary | ICD-10-CM | POA: Diagnosis not present

## 2021-11-04 DIAGNOSIS — M25462 Effusion, left knee: Secondary | ICD-10-CM | POA: Diagnosis not present

## 2021-11-04 DIAGNOSIS — M6281 Muscle weakness (generalized): Secondary | ICD-10-CM | POA: Diagnosis not present

## 2021-11-04 DIAGNOSIS — M25562 Pain in left knee: Secondary | ICD-10-CM | POA: Diagnosis not present

## 2021-11-04 DIAGNOSIS — M25662 Stiffness of left knee, not elsewhere classified: Secondary | ICD-10-CM | POA: Diagnosis not present

## 2021-11-04 DIAGNOSIS — R2689 Other abnormalities of gait and mobility: Secondary | ICD-10-CM | POA: Diagnosis not present

## 2021-11-09 DIAGNOSIS — M25562 Pain in left knee: Secondary | ICD-10-CM | POA: Diagnosis not present

## 2021-11-09 DIAGNOSIS — R2689 Other abnormalities of gait and mobility: Secondary | ICD-10-CM | POA: Diagnosis not present

## 2021-11-09 DIAGNOSIS — M6281 Muscle weakness (generalized): Secondary | ICD-10-CM | POA: Diagnosis not present

## 2021-11-09 DIAGNOSIS — M25462 Effusion, left knee: Secondary | ICD-10-CM | POA: Diagnosis not present

## 2021-11-09 DIAGNOSIS — M25662 Stiffness of left knee, not elsewhere classified: Secondary | ICD-10-CM | POA: Diagnosis not present

## 2021-11-12 DIAGNOSIS — M25562 Pain in left knee: Secondary | ICD-10-CM | POA: Diagnosis not present

## 2021-11-12 DIAGNOSIS — M25462 Effusion, left knee: Secondary | ICD-10-CM | POA: Diagnosis not present

## 2021-11-12 DIAGNOSIS — M6281 Muscle weakness (generalized): Secondary | ICD-10-CM | POA: Diagnosis not present

## 2021-11-12 DIAGNOSIS — R2689 Other abnormalities of gait and mobility: Secondary | ICD-10-CM | POA: Diagnosis not present

## 2021-11-12 DIAGNOSIS — M25662 Stiffness of left knee, not elsewhere classified: Secondary | ICD-10-CM | POA: Diagnosis not present

## 2021-11-14 DIAGNOSIS — E119 Type 2 diabetes mellitus without complications: Secondary | ICD-10-CM | POA: Diagnosis not present

## 2021-11-17 DIAGNOSIS — R2689 Other abnormalities of gait and mobility: Secondary | ICD-10-CM | POA: Diagnosis not present

## 2021-11-17 DIAGNOSIS — M25662 Stiffness of left knee, not elsewhere classified: Secondary | ICD-10-CM | POA: Diagnosis not present

## 2021-11-17 DIAGNOSIS — M25562 Pain in left knee: Secondary | ICD-10-CM | POA: Diagnosis not present

## 2021-11-17 DIAGNOSIS — M25462 Effusion, left knee: Secondary | ICD-10-CM | POA: Diagnosis not present

## 2021-11-17 DIAGNOSIS — M6281 Muscle weakness (generalized): Secondary | ICD-10-CM | POA: Diagnosis not present

## 2021-11-19 DIAGNOSIS — M25562 Pain in left knee: Secondary | ICD-10-CM | POA: Diagnosis not present

## 2021-11-19 DIAGNOSIS — R2689 Other abnormalities of gait and mobility: Secondary | ICD-10-CM | POA: Diagnosis not present

## 2021-11-19 DIAGNOSIS — M6281 Muscle weakness (generalized): Secondary | ICD-10-CM | POA: Diagnosis not present

## 2021-11-19 DIAGNOSIS — M25662 Stiffness of left knee, not elsewhere classified: Secondary | ICD-10-CM | POA: Diagnosis not present

## 2021-11-19 DIAGNOSIS — M25462 Effusion, left knee: Secondary | ICD-10-CM | POA: Diagnosis not present

## 2021-11-25 DIAGNOSIS — M6281 Muscle weakness (generalized): Secondary | ICD-10-CM | POA: Diagnosis not present

## 2021-11-25 DIAGNOSIS — M25462 Effusion, left knee: Secondary | ICD-10-CM | POA: Diagnosis not present

## 2021-11-25 DIAGNOSIS — M25562 Pain in left knee: Secondary | ICD-10-CM | POA: Diagnosis not present

## 2021-11-25 DIAGNOSIS — M25662 Stiffness of left knee, not elsewhere classified: Secondary | ICD-10-CM | POA: Diagnosis not present

## 2021-11-25 DIAGNOSIS — R2689 Other abnormalities of gait and mobility: Secondary | ICD-10-CM | POA: Diagnosis not present

## 2021-11-26 DIAGNOSIS — F17210 Nicotine dependence, cigarettes, uncomplicated: Secondary | ICD-10-CM | POA: Diagnosis not present

## 2021-11-26 DIAGNOSIS — J449 Chronic obstructive pulmonary disease, unspecified: Secondary | ICD-10-CM | POA: Diagnosis not present

## 2021-11-26 DIAGNOSIS — G4733 Obstructive sleep apnea (adult) (pediatric): Secondary | ICD-10-CM | POA: Diagnosis not present

## 2021-11-30 DIAGNOSIS — M6281 Muscle weakness (generalized): Secondary | ICD-10-CM | POA: Diagnosis not present

## 2021-11-30 DIAGNOSIS — R2689 Other abnormalities of gait and mobility: Secondary | ICD-10-CM | POA: Diagnosis not present

## 2021-11-30 DIAGNOSIS — M25562 Pain in left knee: Secondary | ICD-10-CM | POA: Diagnosis not present

## 2021-11-30 DIAGNOSIS — M25462 Effusion, left knee: Secondary | ICD-10-CM | POA: Diagnosis not present

## 2021-11-30 DIAGNOSIS — M25662 Stiffness of left knee, not elsewhere classified: Secondary | ICD-10-CM | POA: Diagnosis not present

## 2021-12-01 DIAGNOSIS — Z96652 Presence of left artificial knee joint: Secondary | ICD-10-CM | POA: Diagnosis not present

## 2021-12-01 DIAGNOSIS — M1712 Unilateral primary osteoarthritis, left knee: Secondary | ICD-10-CM | POA: Diagnosis not present

## 2021-12-03 DIAGNOSIS — M6281 Muscle weakness (generalized): Secondary | ICD-10-CM | POA: Diagnosis not present

## 2021-12-03 DIAGNOSIS — M25662 Stiffness of left knee, not elsewhere classified: Secondary | ICD-10-CM | POA: Diagnosis not present

## 2021-12-03 DIAGNOSIS — M25462 Effusion, left knee: Secondary | ICD-10-CM | POA: Diagnosis not present

## 2021-12-03 DIAGNOSIS — R2689 Other abnormalities of gait and mobility: Secondary | ICD-10-CM | POA: Diagnosis not present

## 2021-12-03 DIAGNOSIS — M25562 Pain in left knee: Secondary | ICD-10-CM | POA: Diagnosis not present

## 2021-12-06 DIAGNOSIS — N3941 Urge incontinence: Secondary | ICD-10-CM | POA: Diagnosis not present

## 2021-12-06 DIAGNOSIS — H10021 Other mucopurulent conjunctivitis, right eye: Secondary | ICD-10-CM | POA: Diagnosis not present

## 2021-12-06 DIAGNOSIS — E1169 Type 2 diabetes mellitus with other specified complication: Secondary | ICD-10-CM | POA: Diagnosis not present

## 2021-12-06 DIAGNOSIS — N401 Enlarged prostate with lower urinary tract symptoms: Secondary | ICD-10-CM | POA: Diagnosis not present

## 2021-12-06 DIAGNOSIS — N521 Erectile dysfunction due to diseases classified elsewhere: Secondary | ICD-10-CM | POA: Diagnosis not present

## 2021-12-09 DIAGNOSIS — M25662 Stiffness of left knee, not elsewhere classified: Secondary | ICD-10-CM | POA: Diagnosis not present

## 2021-12-09 DIAGNOSIS — M25462 Effusion, left knee: Secondary | ICD-10-CM | POA: Diagnosis not present

## 2021-12-09 DIAGNOSIS — M6281 Muscle weakness (generalized): Secondary | ICD-10-CM | POA: Diagnosis not present

## 2021-12-09 DIAGNOSIS — R2689 Other abnormalities of gait and mobility: Secondary | ICD-10-CM | POA: Diagnosis not present

## 2021-12-09 DIAGNOSIS — M25562 Pain in left knee: Secondary | ICD-10-CM | POA: Diagnosis not present

## 2021-12-10 DIAGNOSIS — M25562 Pain in left knee: Secondary | ICD-10-CM | POA: Diagnosis not present

## 2021-12-10 DIAGNOSIS — R2689 Other abnormalities of gait and mobility: Secondary | ICD-10-CM | POA: Diagnosis not present

## 2021-12-10 DIAGNOSIS — M25662 Stiffness of left knee, not elsewhere classified: Secondary | ICD-10-CM | POA: Diagnosis not present

## 2021-12-10 DIAGNOSIS — M25462 Effusion, left knee: Secondary | ICD-10-CM | POA: Diagnosis not present

## 2021-12-10 DIAGNOSIS — M6281 Muscle weakness (generalized): Secondary | ICD-10-CM | POA: Diagnosis not present

## 2021-12-15 DIAGNOSIS — E119 Type 2 diabetes mellitus without complications: Secondary | ICD-10-CM | POA: Diagnosis not present

## 2021-12-15 DIAGNOSIS — R2689 Other abnormalities of gait and mobility: Secondary | ICD-10-CM | POA: Diagnosis not present

## 2021-12-15 DIAGNOSIS — M25562 Pain in left knee: Secondary | ICD-10-CM | POA: Diagnosis not present

## 2021-12-15 DIAGNOSIS — M25462 Effusion, left knee: Secondary | ICD-10-CM | POA: Diagnosis not present

## 2021-12-15 DIAGNOSIS — M6281 Muscle weakness (generalized): Secondary | ICD-10-CM | POA: Diagnosis not present

## 2021-12-15 DIAGNOSIS — M25662 Stiffness of left knee, not elsewhere classified: Secondary | ICD-10-CM | POA: Diagnosis not present

## 2021-12-17 DIAGNOSIS — M6281 Muscle weakness (generalized): Secondary | ICD-10-CM | POA: Diagnosis not present

## 2021-12-17 DIAGNOSIS — M25462 Effusion, left knee: Secondary | ICD-10-CM | POA: Diagnosis not present

## 2021-12-17 DIAGNOSIS — R2689 Other abnormalities of gait and mobility: Secondary | ICD-10-CM | POA: Diagnosis not present

## 2021-12-17 DIAGNOSIS — M25562 Pain in left knee: Secondary | ICD-10-CM | POA: Diagnosis not present

## 2021-12-17 DIAGNOSIS — M25662 Stiffness of left knee, not elsewhere classified: Secondary | ICD-10-CM | POA: Diagnosis not present

## 2021-12-22 DIAGNOSIS — M6281 Muscle weakness (generalized): Secondary | ICD-10-CM | POA: Diagnosis not present

## 2021-12-22 DIAGNOSIS — M25662 Stiffness of left knee, not elsewhere classified: Secondary | ICD-10-CM | POA: Diagnosis not present

## 2021-12-22 DIAGNOSIS — M25562 Pain in left knee: Secondary | ICD-10-CM | POA: Diagnosis not present

## 2021-12-22 DIAGNOSIS — R2689 Other abnormalities of gait and mobility: Secondary | ICD-10-CM | POA: Diagnosis not present

## 2021-12-22 DIAGNOSIS — M25462 Effusion, left knee: Secondary | ICD-10-CM | POA: Diagnosis not present

## 2021-12-24 DIAGNOSIS — M25462 Effusion, left knee: Secondary | ICD-10-CM | POA: Diagnosis not present

## 2021-12-24 DIAGNOSIS — E785 Hyperlipidemia, unspecified: Secondary | ICD-10-CM | POA: Diagnosis not present

## 2021-12-24 DIAGNOSIS — N401 Enlarged prostate with lower urinary tract symptoms: Secondary | ICD-10-CM | POA: Diagnosis not present

## 2021-12-24 DIAGNOSIS — J40 Bronchitis, not specified as acute or chronic: Secondary | ICD-10-CM | POA: Diagnosis not present

## 2021-12-24 DIAGNOSIS — G894 Chronic pain syndrome: Secondary | ICD-10-CM | POA: Diagnosis not present

## 2021-12-24 DIAGNOSIS — R2689 Other abnormalities of gait and mobility: Secondary | ICD-10-CM | POA: Diagnosis not present

## 2021-12-24 DIAGNOSIS — J329 Chronic sinusitis, unspecified: Secondary | ICD-10-CM | POA: Diagnosis not present

## 2021-12-24 DIAGNOSIS — M6281 Muscle weakness (generalized): Secondary | ICD-10-CM | POA: Diagnosis not present

## 2021-12-24 DIAGNOSIS — E1165 Type 2 diabetes mellitus with hyperglycemia: Secondary | ICD-10-CM | POA: Diagnosis not present

## 2021-12-24 DIAGNOSIS — M25562 Pain in left knee: Secondary | ICD-10-CM | POA: Diagnosis not present

## 2021-12-24 DIAGNOSIS — K2960 Other gastritis without bleeding: Secondary | ICD-10-CM | POA: Diagnosis not present

## 2021-12-24 DIAGNOSIS — M25662 Stiffness of left knee, not elsewhere classified: Secondary | ICD-10-CM | POA: Diagnosis not present

## 2021-12-24 DIAGNOSIS — E1142 Type 2 diabetes mellitus with diabetic polyneuropathy: Secondary | ICD-10-CM | POA: Diagnosis not present

## 2021-12-24 DIAGNOSIS — I10 Essential (primary) hypertension: Secondary | ICD-10-CM | POA: Diagnosis not present

## 2021-12-28 DIAGNOSIS — R2689 Other abnormalities of gait and mobility: Secondary | ICD-10-CM | POA: Diagnosis not present

## 2021-12-28 DIAGNOSIS — M25562 Pain in left knee: Secondary | ICD-10-CM | POA: Diagnosis not present

## 2021-12-28 DIAGNOSIS — M25462 Effusion, left knee: Secondary | ICD-10-CM | POA: Diagnosis not present

## 2021-12-28 DIAGNOSIS — M6281 Muscle weakness (generalized): Secondary | ICD-10-CM | POA: Diagnosis not present

## 2021-12-28 DIAGNOSIS — M25662 Stiffness of left knee, not elsewhere classified: Secondary | ICD-10-CM | POA: Diagnosis not present

## 2021-12-28 DIAGNOSIS — D510 Vitamin B12 deficiency anemia due to intrinsic factor deficiency: Secondary | ICD-10-CM | POA: Diagnosis not present

## 2021-12-31 DIAGNOSIS — M25462 Effusion, left knee: Secondary | ICD-10-CM | POA: Diagnosis not present

## 2021-12-31 DIAGNOSIS — M25562 Pain in left knee: Secondary | ICD-10-CM | POA: Diagnosis not present

## 2021-12-31 DIAGNOSIS — M25662 Stiffness of left knee, not elsewhere classified: Secondary | ICD-10-CM | POA: Diagnosis not present

## 2021-12-31 DIAGNOSIS — R2689 Other abnormalities of gait and mobility: Secondary | ICD-10-CM | POA: Diagnosis not present

## 2021-12-31 DIAGNOSIS — M6281 Muscle weakness (generalized): Secondary | ICD-10-CM | POA: Diagnosis not present

## 2022-01-03 DIAGNOSIS — M6281 Muscle weakness (generalized): Secondary | ICD-10-CM | POA: Diagnosis not present

## 2022-01-03 DIAGNOSIS — M25562 Pain in left knee: Secondary | ICD-10-CM | POA: Diagnosis not present

## 2022-01-03 DIAGNOSIS — M25662 Stiffness of left knee, not elsewhere classified: Secondary | ICD-10-CM | POA: Diagnosis not present

## 2022-01-03 DIAGNOSIS — R2689 Other abnormalities of gait and mobility: Secondary | ICD-10-CM | POA: Diagnosis not present

## 2022-01-03 DIAGNOSIS — M25462 Effusion, left knee: Secondary | ICD-10-CM | POA: Diagnosis not present

## 2022-01-11 DIAGNOSIS — M25462 Effusion, left knee: Secondary | ICD-10-CM | POA: Diagnosis not present

## 2022-01-11 DIAGNOSIS — M25562 Pain in left knee: Secondary | ICD-10-CM | POA: Diagnosis not present

## 2022-01-11 DIAGNOSIS — R2689 Other abnormalities of gait and mobility: Secondary | ICD-10-CM | POA: Diagnosis not present

## 2022-01-11 DIAGNOSIS — M25662 Stiffness of left knee, not elsewhere classified: Secondary | ICD-10-CM | POA: Diagnosis not present

## 2022-01-11 DIAGNOSIS — M6281 Muscle weakness (generalized): Secondary | ICD-10-CM | POA: Diagnosis not present

## 2022-01-13 DIAGNOSIS — R2689 Other abnormalities of gait and mobility: Secondary | ICD-10-CM | POA: Diagnosis not present

## 2022-01-13 DIAGNOSIS — M25662 Stiffness of left knee, not elsewhere classified: Secondary | ICD-10-CM | POA: Diagnosis not present

## 2022-01-13 DIAGNOSIS — M25562 Pain in left knee: Secondary | ICD-10-CM | POA: Diagnosis not present

## 2022-01-13 DIAGNOSIS — M6281 Muscle weakness (generalized): Secondary | ICD-10-CM | POA: Diagnosis not present

## 2022-01-13 DIAGNOSIS — M25462 Effusion, left knee: Secondary | ICD-10-CM | POA: Diagnosis not present

## 2022-01-14 DIAGNOSIS — E119 Type 2 diabetes mellitus without complications: Secondary | ICD-10-CM | POA: Diagnosis not present

## 2022-01-17 DIAGNOSIS — M1712 Unilateral primary osteoarthritis, left knee: Secondary | ICD-10-CM | POA: Diagnosis not present

## 2022-01-27 DIAGNOSIS — M48062 Spinal stenosis, lumbar region with neurogenic claudication: Secondary | ICD-10-CM | POA: Diagnosis not present

## 2022-01-28 DIAGNOSIS — L301 Dyshidrosis [pompholyx]: Secondary | ICD-10-CM | POA: Diagnosis not present

## 2022-01-28 DIAGNOSIS — L209 Atopic dermatitis, unspecified: Secondary | ICD-10-CM | POA: Diagnosis not present

## 2022-01-28 DIAGNOSIS — L299 Pruritus, unspecified: Secondary | ICD-10-CM | POA: Diagnosis not present

## 2022-02-02 DIAGNOSIS — M6281 Muscle weakness (generalized): Secondary | ICD-10-CM | POA: Diagnosis not present

## 2022-02-02 DIAGNOSIS — M25662 Stiffness of left knee, not elsewhere classified: Secondary | ICD-10-CM | POA: Diagnosis not present

## 2022-02-02 DIAGNOSIS — M25462 Effusion, left knee: Secondary | ICD-10-CM | POA: Diagnosis not present

## 2022-02-02 DIAGNOSIS — M25562 Pain in left knee: Secondary | ICD-10-CM | POA: Diagnosis not present

## 2022-02-02 DIAGNOSIS — R2689 Other abnormalities of gait and mobility: Secondary | ICD-10-CM | POA: Diagnosis not present

## 2022-02-03 DIAGNOSIS — Z20828 Contact with and (suspected) exposure to other viral communicable diseases: Secondary | ICD-10-CM | POA: Diagnosis not present

## 2022-02-04 DIAGNOSIS — M25462 Effusion, left knee: Secondary | ICD-10-CM | POA: Diagnosis not present

## 2022-02-04 DIAGNOSIS — M25562 Pain in left knee: Secondary | ICD-10-CM | POA: Diagnosis not present

## 2022-02-04 DIAGNOSIS — R2689 Other abnormalities of gait and mobility: Secondary | ICD-10-CM | POA: Diagnosis not present

## 2022-02-04 DIAGNOSIS — M25662 Stiffness of left knee, not elsewhere classified: Secondary | ICD-10-CM | POA: Diagnosis not present

## 2022-02-04 DIAGNOSIS — M6281 Muscle weakness (generalized): Secondary | ICD-10-CM | POA: Diagnosis not present

## 2022-02-13 DIAGNOSIS — E119 Type 2 diabetes mellitus without complications: Secondary | ICD-10-CM | POA: Diagnosis not present

## 2022-02-21 DIAGNOSIS — F1120 Opioid dependence, uncomplicated: Secondary | ICD-10-CM | POA: Diagnosis not present

## 2022-02-21 DIAGNOSIS — Z6834 Body mass index (BMI) 34.0-34.9, adult: Secondary | ICD-10-CM | POA: Diagnosis not present

## 2022-02-21 DIAGNOSIS — M48062 Spinal stenosis, lumbar region with neurogenic claudication: Secondary | ICD-10-CM | POA: Diagnosis not present

## 2022-02-22 DIAGNOSIS — M25562 Pain in left knee: Secondary | ICD-10-CM | POA: Diagnosis not present

## 2022-02-22 DIAGNOSIS — R2689 Other abnormalities of gait and mobility: Secondary | ICD-10-CM | POA: Diagnosis not present

## 2022-02-22 DIAGNOSIS — M6281 Muscle weakness (generalized): Secondary | ICD-10-CM | POA: Diagnosis not present

## 2022-02-22 DIAGNOSIS — M25662 Stiffness of left knee, not elsewhere classified: Secondary | ICD-10-CM | POA: Diagnosis not present

## 2022-02-22 DIAGNOSIS — M25462 Effusion, left knee: Secondary | ICD-10-CM | POA: Diagnosis not present

## 2022-02-28 DIAGNOSIS — R519 Headache, unspecified: Secondary | ICD-10-CM | POA: Diagnosis not present

## 2022-02-28 DIAGNOSIS — R509 Fever, unspecified: Secondary | ICD-10-CM | POA: Diagnosis not present

## 2022-02-28 DIAGNOSIS — J069 Acute upper respiratory infection, unspecified: Secondary | ICD-10-CM | POA: Diagnosis not present

## 2022-03-02 DIAGNOSIS — L299 Pruritus, unspecified: Secondary | ICD-10-CM | POA: Diagnosis not present

## 2022-03-02 DIAGNOSIS — L209 Atopic dermatitis, unspecified: Secondary | ICD-10-CM | POA: Diagnosis not present

## 2022-03-03 DIAGNOSIS — N400 Enlarged prostate without lower urinary tract symptoms: Secondary | ICD-10-CM | POA: Diagnosis not present

## 2022-03-03 DIAGNOSIS — H538 Other visual disturbances: Secondary | ICD-10-CM | POA: Diagnosis not present

## 2022-03-03 DIAGNOSIS — J449 Chronic obstructive pulmonary disease, unspecified: Secondary | ICD-10-CM | POA: Diagnosis not present

## 2022-03-03 DIAGNOSIS — G4733 Obstructive sleep apnea (adult) (pediatric): Secondary | ICD-10-CM | POA: Diagnosis not present

## 2022-03-03 DIAGNOSIS — M47812 Spondylosis without myelopathy or radiculopathy, cervical region: Secondary | ICD-10-CM | POA: Diagnosis not present

## 2022-03-03 DIAGNOSIS — I639 Cerebral infarction, unspecified: Secondary | ICD-10-CM | POA: Diagnosis not present

## 2022-03-03 DIAGNOSIS — M1990 Unspecified osteoarthritis, unspecified site: Secondary | ICD-10-CM | POA: Diagnosis not present

## 2022-03-03 DIAGNOSIS — E7800 Pure hypercholesterolemia, unspecified: Secondary | ICD-10-CM | POA: Diagnosis not present

## 2022-03-03 DIAGNOSIS — J341 Cyst and mucocele of nose and nasal sinus: Secondary | ICD-10-CM | POA: Diagnosis not present

## 2022-03-03 DIAGNOSIS — I10 Essential (primary) hypertension: Secondary | ICD-10-CM | POA: Diagnosis not present

## 2022-03-03 DIAGNOSIS — I219 Acute myocardial infarction, unspecified: Secondary | ICD-10-CM | POA: Diagnosis not present

## 2022-03-03 DIAGNOSIS — E1142 Type 2 diabetes mellitus with diabetic polyneuropathy: Secondary | ICD-10-CM | POA: Diagnosis not present

## 2022-03-03 DIAGNOSIS — R519 Headache, unspecified: Secondary | ICD-10-CM | POA: Diagnosis not present

## 2022-03-03 DIAGNOSIS — I2510 Atherosclerotic heart disease of native coronary artery without angina pectoris: Secondary | ICD-10-CM | POA: Diagnosis not present

## 2022-03-03 DIAGNOSIS — H4921 Sixth [abducent] nerve palsy, right eye: Secondary | ICD-10-CM | POA: Diagnosis not present

## 2022-03-04 DIAGNOSIS — E1142 Type 2 diabetes mellitus with diabetic polyneuropathy: Secondary | ICD-10-CM | POA: Diagnosis not present

## 2022-03-04 DIAGNOSIS — N400 Enlarged prostate without lower urinary tract symptoms: Secondary | ICD-10-CM | POA: Diagnosis not present

## 2022-03-04 DIAGNOSIS — H532 Diplopia: Secondary | ICD-10-CM | POA: Diagnosis not present

## 2022-03-04 DIAGNOSIS — I10 Essential (primary) hypertension: Secondary | ICD-10-CM | POA: Diagnosis not present

## 2022-03-04 DIAGNOSIS — I351 Nonrheumatic aortic (valve) insufficiency: Secondary | ICD-10-CM | POA: Diagnosis not present

## 2022-03-04 DIAGNOSIS — E7800 Pure hypercholesterolemia, unspecified: Secondary | ICD-10-CM | POA: Diagnosis not present

## 2022-03-04 DIAGNOSIS — I2510 Atherosclerotic heart disease of native coronary artery without angina pectoris: Secondary | ICD-10-CM | POA: Diagnosis not present

## 2022-03-04 DIAGNOSIS — G4733 Obstructive sleep apnea (adult) (pediatric): Secondary | ICD-10-CM | POA: Diagnosis not present

## 2022-03-04 DIAGNOSIS — J9611 Chronic respiratory failure with hypoxia: Secondary | ICD-10-CM | POA: Diagnosis not present

## 2022-03-04 DIAGNOSIS — I6389 Other cerebral infarction: Secondary | ICD-10-CM | POA: Diagnosis not present

## 2022-03-04 DIAGNOSIS — M1990 Unspecified osteoarthritis, unspecified site: Secondary | ICD-10-CM | POA: Diagnosis not present

## 2022-03-04 DIAGNOSIS — I639 Cerebral infarction, unspecified: Secondary | ICD-10-CM | POA: Diagnosis not present

## 2022-03-04 DIAGNOSIS — J449 Chronic obstructive pulmonary disease, unspecified: Secondary | ICD-10-CM | POA: Diagnosis not present

## 2022-03-10 DIAGNOSIS — E1142 Type 2 diabetes mellitus with diabetic polyneuropathy: Secondary | ICD-10-CM | POA: Diagnosis not present

## 2022-03-10 DIAGNOSIS — H532 Diplopia: Secondary | ICD-10-CM | POA: Diagnosis not present

## 2022-03-10 DIAGNOSIS — F411 Generalized anxiety disorder: Secondary | ICD-10-CM | POA: Diagnosis not present

## 2022-03-10 DIAGNOSIS — E1165 Type 2 diabetes mellitus with hyperglycemia: Secondary | ICD-10-CM | POA: Diagnosis not present

## 2022-03-10 DIAGNOSIS — E785 Hyperlipidemia, unspecified: Secondary | ICD-10-CM | POA: Diagnosis not present

## 2022-03-10 DIAGNOSIS — E669 Obesity, unspecified: Secondary | ICD-10-CM | POA: Diagnosis not present

## 2022-03-10 DIAGNOSIS — F17210 Nicotine dependence, cigarettes, uncomplicated: Secondary | ICD-10-CM | POA: Diagnosis not present

## 2022-03-10 DIAGNOSIS — I672 Cerebral atherosclerosis: Secondary | ICD-10-CM | POA: Diagnosis not present

## 2022-03-10 DIAGNOSIS — I10 Essential (primary) hypertension: Secondary | ICD-10-CM | POA: Diagnosis not present

## 2022-03-16 DIAGNOSIS — N3941 Urge incontinence: Secondary | ICD-10-CM | POA: Diagnosis not present

## 2022-03-16 DIAGNOSIS — Z125 Encounter for screening for malignant neoplasm of prostate: Secondary | ICD-10-CM | POA: Diagnosis not present

## 2022-03-16 DIAGNOSIS — N401 Enlarged prostate with lower urinary tract symptoms: Secondary | ICD-10-CM | POA: Diagnosis not present

## 2022-03-16 DIAGNOSIS — N521 Erectile dysfunction due to diseases classified elsewhere: Secondary | ICD-10-CM | POA: Diagnosis not present

## 2022-03-17 DIAGNOSIS — E119 Type 2 diabetes mellitus without complications: Secondary | ICD-10-CM | POA: Diagnosis not present

## 2022-03-23 DIAGNOSIS — M25462 Effusion, left knee: Secondary | ICD-10-CM | POA: Diagnosis not present

## 2022-03-23 DIAGNOSIS — R2689 Other abnormalities of gait and mobility: Secondary | ICD-10-CM | POA: Diagnosis not present

## 2022-03-23 DIAGNOSIS — M6281 Muscle weakness (generalized): Secondary | ICD-10-CM | POA: Diagnosis not present

## 2022-03-23 DIAGNOSIS — M25562 Pain in left knee: Secondary | ICD-10-CM | POA: Diagnosis not present

## 2022-03-23 DIAGNOSIS — M25662 Stiffness of left knee, not elsewhere classified: Secondary | ICD-10-CM | POA: Diagnosis not present

## 2022-04-04 DIAGNOSIS — J9611 Chronic respiratory failure with hypoxia: Secondary | ICD-10-CM | POA: Diagnosis not present

## 2022-04-09 IMAGING — CR DG FOOT COMPLETE 3+V*R*
3 series · 3 of 3 positions shown · non-contrast
Comparison: None.

CLINICAL DATA: Trauma

EXAM:
RIGHT FOOT COMPLETE - 3+ VIEW

[foot ap]
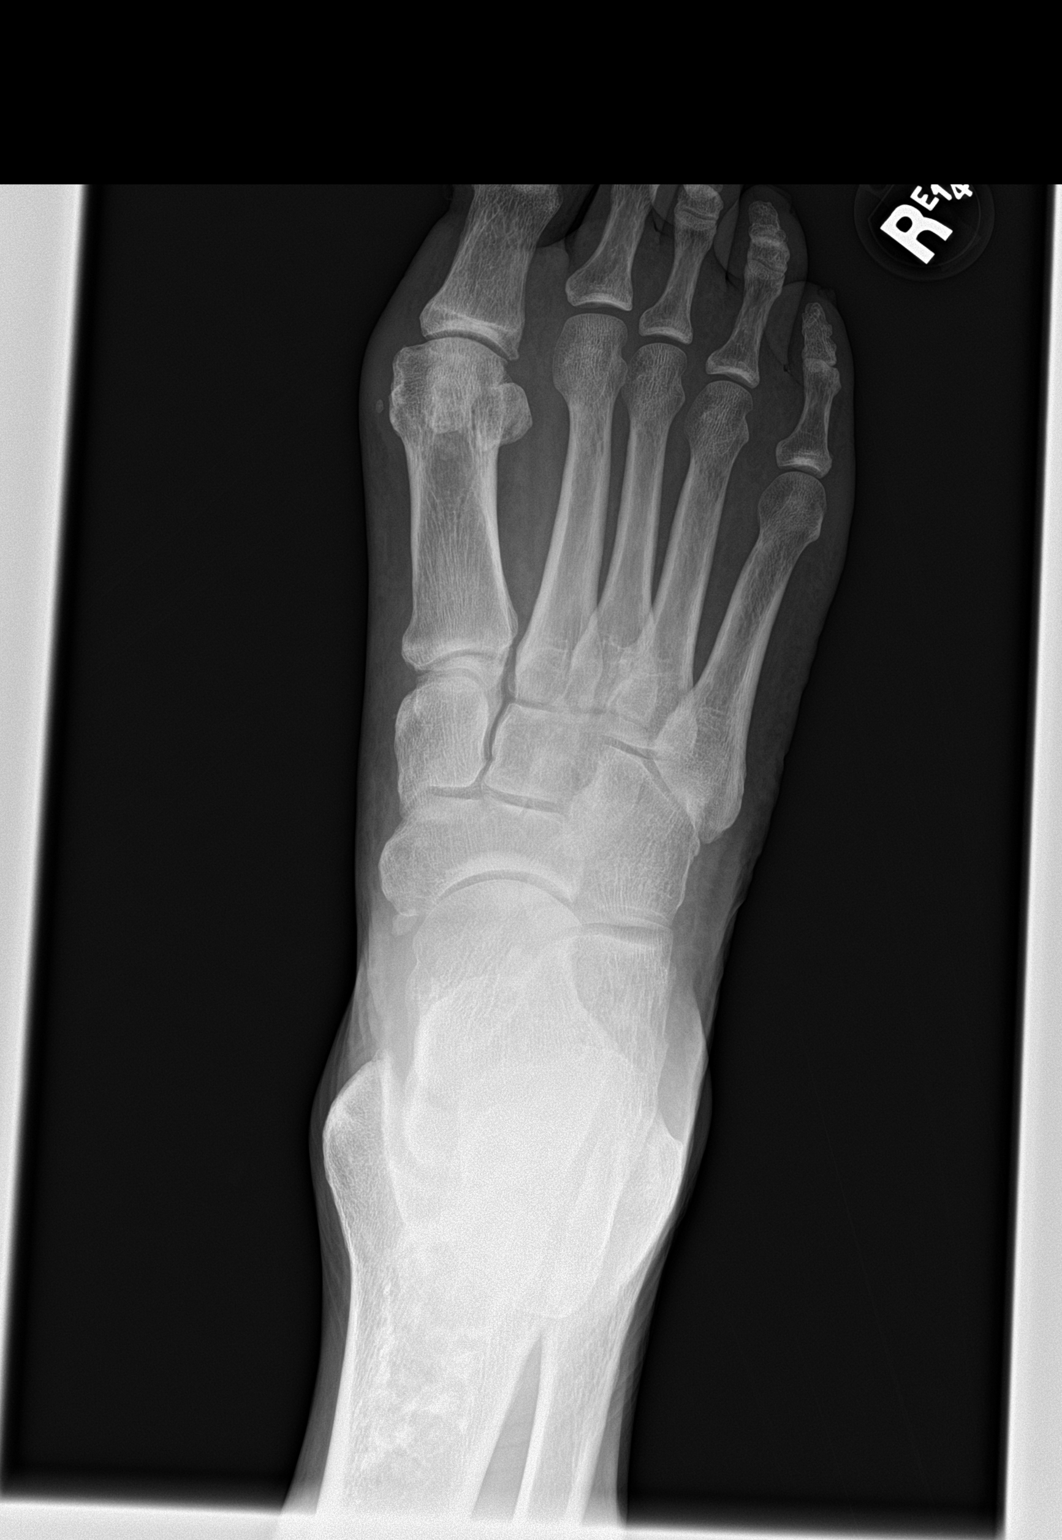

[foot obl]
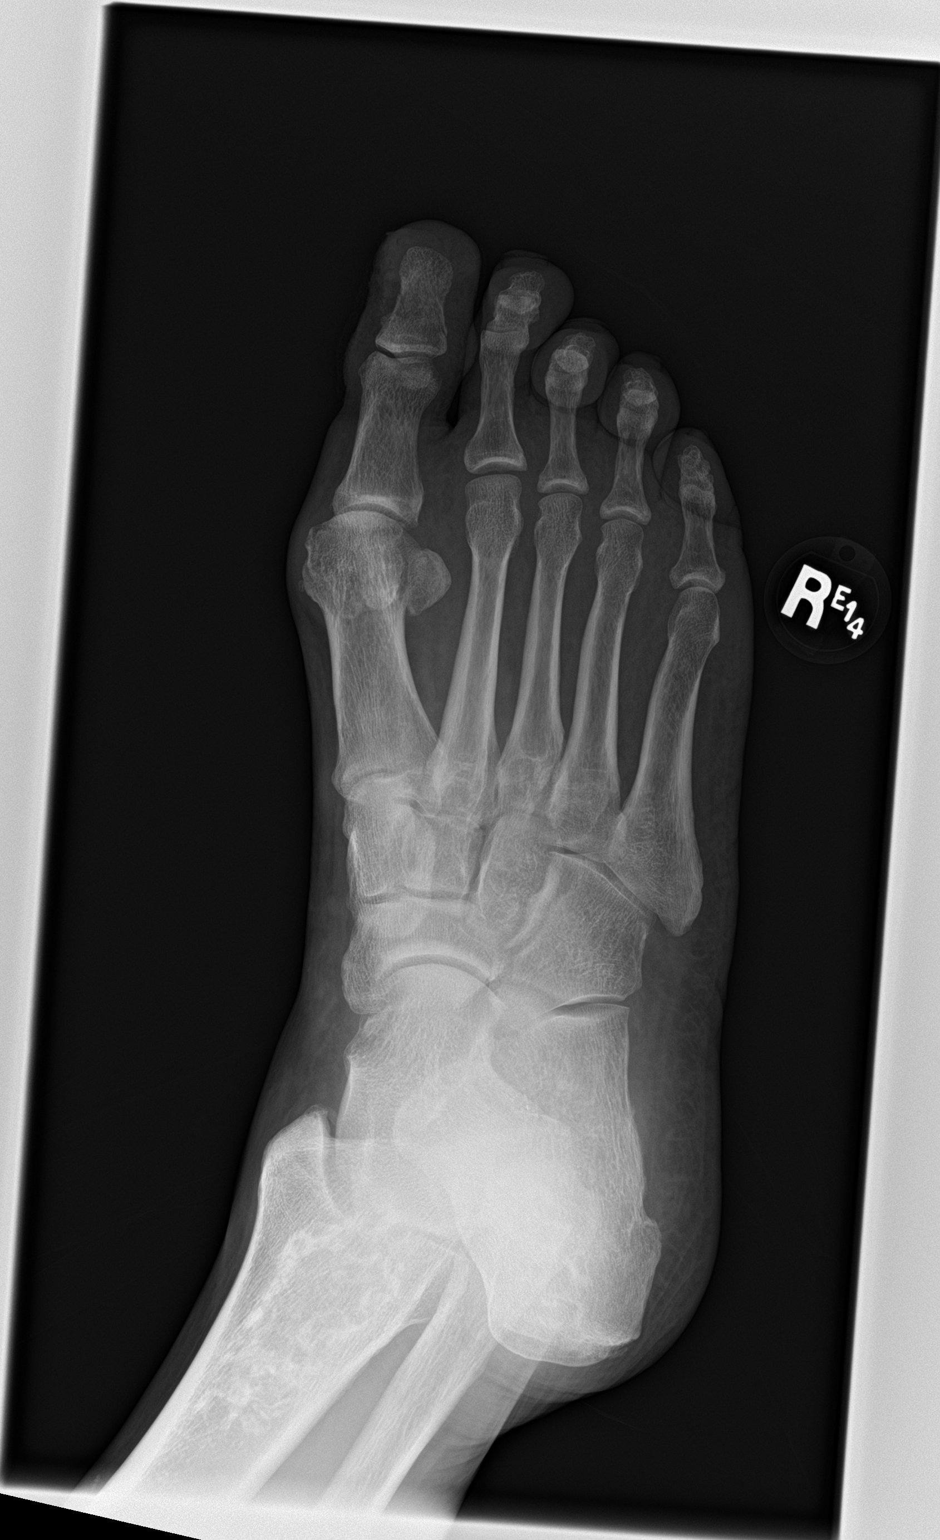

[foot lat]
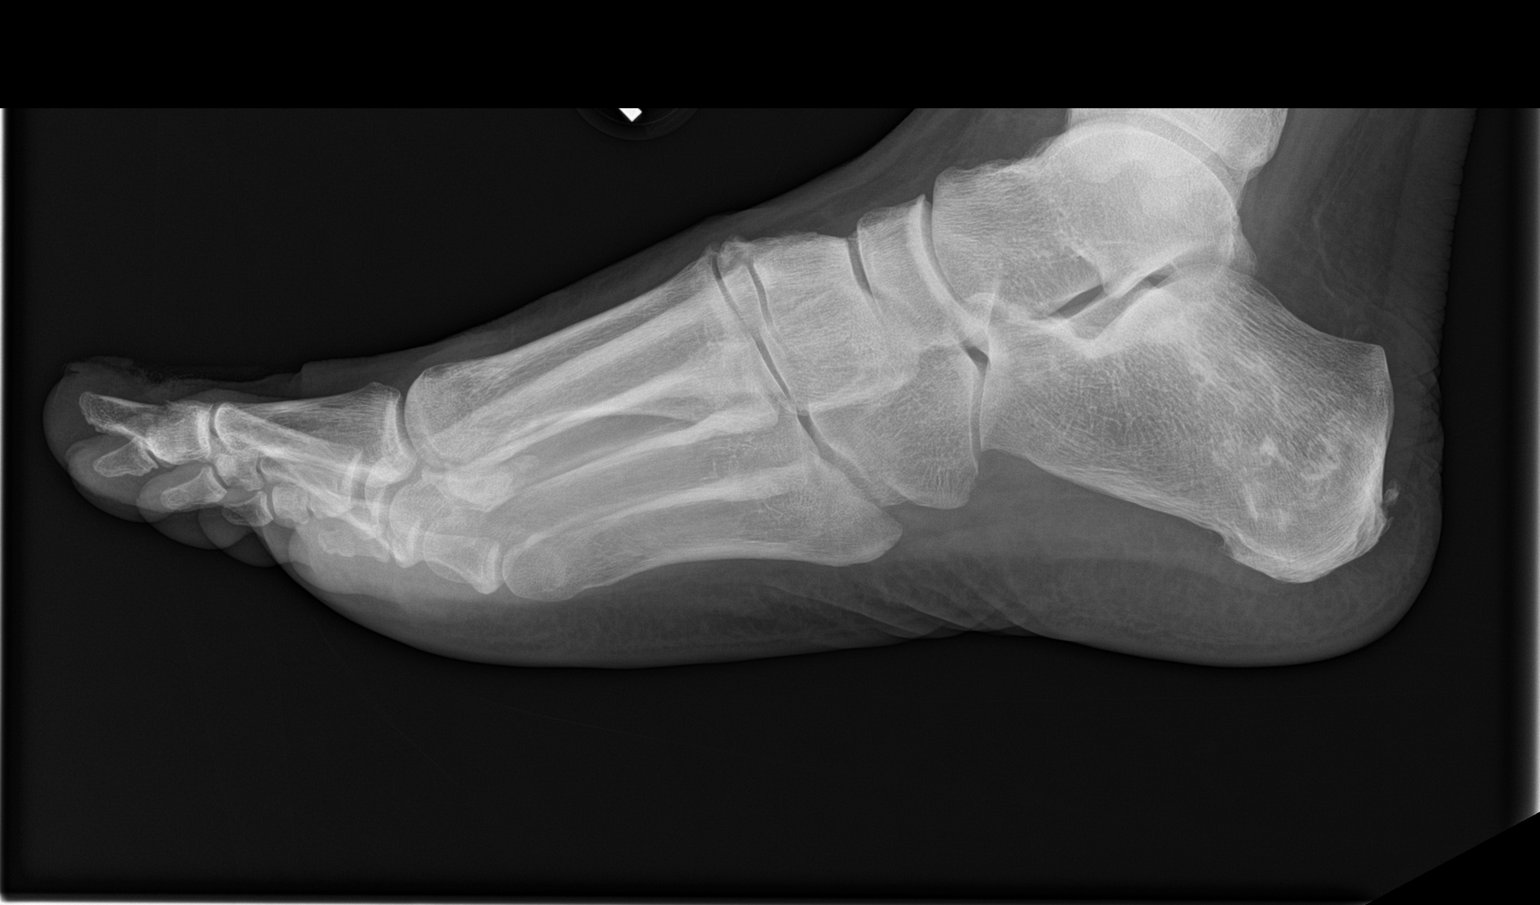

[3 of 3 positions shown; findings below may reference images not displayed]

FINDINGS: Multiple fracture lines of the great toe phalanx with no significant
displacement. Soft tissue swelling of the great toe. Mild
degenerative changes of the mid foot and first MTP joint.
Serpiginous sclerotic lesions of the calcaneus and distal tibia,
likely due to prior bone infarcts.
IMPRESSION: Nondisplaced fracture of the right great toe phalanx.

## 2022-04-17 DIAGNOSIS — E119 Type 2 diabetes mellitus without complications: Secondary | ICD-10-CM | POA: Diagnosis not present

## 2022-04-18 DIAGNOSIS — M1712 Unilateral primary osteoarthritis, left knee: Secondary | ICD-10-CM | POA: Diagnosis not present

## 2022-04-18 DIAGNOSIS — S50812A Abrasion of left forearm, initial encounter: Secondary | ICD-10-CM | POA: Diagnosis not present

## 2022-05-02 DIAGNOSIS — M48062 Spinal stenosis, lumbar region with neurogenic claudication: Secondary | ICD-10-CM | POA: Diagnosis not present

## 2022-05-03 DIAGNOSIS — J9611 Chronic respiratory failure with hypoxia: Secondary | ICD-10-CM | POA: Diagnosis not present

## 2022-05-04 DIAGNOSIS — H5213 Myopia, bilateral: Secondary | ICD-10-CM | POA: Diagnosis not present

## 2022-05-04 DIAGNOSIS — E119 Type 2 diabetes mellitus without complications: Secondary | ICD-10-CM | POA: Diagnosis not present

## 2022-05-10 DIAGNOSIS — M25562 Pain in left knee: Secondary | ICD-10-CM | POA: Diagnosis not present

## 2022-05-10 DIAGNOSIS — R2689 Other abnormalities of gait and mobility: Secondary | ICD-10-CM | POA: Diagnosis not present

## 2022-05-10 DIAGNOSIS — M6281 Muscle weakness (generalized): Secondary | ICD-10-CM | POA: Diagnosis not present

## 2022-05-10 DIAGNOSIS — M25662 Stiffness of left knee, not elsewhere classified: Secondary | ICD-10-CM | POA: Diagnosis not present

## 2022-05-10 DIAGNOSIS — M25462 Effusion, left knee: Secondary | ICD-10-CM | POA: Diagnosis not present

## 2022-05-18 DIAGNOSIS — E119 Type 2 diabetes mellitus without complications: Secondary | ICD-10-CM | POA: Diagnosis not present

## 2022-05-23 DIAGNOSIS — M5416 Radiculopathy, lumbar region: Secondary | ICD-10-CM | POA: Diagnosis not present

## 2022-05-23 DIAGNOSIS — Z6835 Body mass index (BMI) 35.0-35.9, adult: Secondary | ICD-10-CM | POA: Diagnosis not present

## 2022-05-23 DIAGNOSIS — M48062 Spinal stenosis, lumbar region with neurogenic claudication: Secondary | ICD-10-CM | POA: Diagnosis not present

## 2022-05-23 DIAGNOSIS — F1120 Opioid dependence, uncomplicated: Secondary | ICD-10-CM | POA: Diagnosis not present

## 2022-05-27 DIAGNOSIS — E669 Obesity, unspecified: Secondary | ICD-10-CM | POA: Diagnosis not present

## 2022-05-27 DIAGNOSIS — G4733 Obstructive sleep apnea (adult) (pediatric): Secondary | ICD-10-CM | POA: Diagnosis not present

## 2022-05-27 DIAGNOSIS — J4489 Other specified chronic obstructive pulmonary disease: Secondary | ICD-10-CM | POA: Diagnosis not present

## 2022-05-27 DIAGNOSIS — J309 Allergic rhinitis, unspecified: Secondary | ICD-10-CM | POA: Diagnosis not present

## 2022-05-27 DIAGNOSIS — J449 Chronic obstructive pulmonary disease, unspecified: Secondary | ICD-10-CM | POA: Diagnosis not present

## 2022-05-27 DIAGNOSIS — F17210 Nicotine dependence, cigarettes, uncomplicated: Secondary | ICD-10-CM | POA: Diagnosis not present

## 2022-05-27 DIAGNOSIS — R0609 Other forms of dyspnea: Secondary | ICD-10-CM | POA: Diagnosis not present

## 2022-06-01 DIAGNOSIS — M25662 Stiffness of left knee, not elsewhere classified: Secondary | ICD-10-CM | POA: Diagnosis not present

## 2022-06-01 DIAGNOSIS — M6281 Muscle weakness (generalized): Secondary | ICD-10-CM | POA: Diagnosis not present

## 2022-06-01 DIAGNOSIS — M25462 Effusion, left knee: Secondary | ICD-10-CM | POA: Diagnosis not present

## 2022-06-01 DIAGNOSIS — R2689 Other abnormalities of gait and mobility: Secondary | ICD-10-CM | POA: Diagnosis not present

## 2022-06-01 DIAGNOSIS — M25562 Pain in left knee: Secondary | ICD-10-CM | POA: Diagnosis not present

## 2022-06-03 DIAGNOSIS — J9611 Chronic respiratory failure with hypoxia: Secondary | ICD-10-CM | POA: Diagnosis not present

## 2022-06-16 DIAGNOSIS — G4733 Obstructive sleep apnea (adult) (pediatric): Secondary | ICD-10-CM | POA: Diagnosis not present

## 2022-06-17 DIAGNOSIS — E119 Type 2 diabetes mellitus without complications: Secondary | ICD-10-CM | POA: Diagnosis not present

## 2022-07-03 DIAGNOSIS — J9611 Chronic respiratory failure with hypoxia: Secondary | ICD-10-CM | POA: Diagnosis not present

## 2022-07-06 DIAGNOSIS — D485 Neoplasm of uncertain behavior of skin: Secondary | ICD-10-CM | POA: Diagnosis not present

## 2022-07-06 DIAGNOSIS — L209 Atopic dermatitis, unspecified: Secondary | ICD-10-CM | POA: Diagnosis not present

## 2022-07-06 DIAGNOSIS — E538 Deficiency of other specified B group vitamins: Secondary | ICD-10-CM | POA: Diagnosis not present

## 2022-07-17 DIAGNOSIS — E119 Type 2 diabetes mellitus without complications: Secondary | ICD-10-CM | POA: Diagnosis not present

## 2022-07-19 DIAGNOSIS — M1712 Unilateral primary osteoarthritis, left knee: Secondary | ICD-10-CM | POA: Diagnosis not present

## 2022-07-28 DIAGNOSIS — N401 Enlarged prostate with lower urinary tract symptoms: Secondary | ICD-10-CM | POA: Diagnosis not present

## 2022-07-28 DIAGNOSIS — N3941 Urge incontinence: Secondary | ICD-10-CM | POA: Diagnosis not present

## 2022-07-28 DIAGNOSIS — N521 Erectile dysfunction due to diseases classified elsewhere: Secondary | ICD-10-CM | POA: Diagnosis not present

## 2022-08-03 DIAGNOSIS — H04129 Dry eye syndrome of unspecified lacrimal gland: Secondary | ICD-10-CM | POA: Diagnosis not present

## 2022-08-03 DIAGNOSIS — H40012 Open angle with borderline findings, low risk, left eye: Secondary | ICD-10-CM | POA: Diagnosis not present

## 2022-08-03 DIAGNOSIS — J9611 Chronic respiratory failure with hypoxia: Secondary | ICD-10-CM | POA: Diagnosis not present

## 2022-08-12 DIAGNOSIS — A932 Colorado tick fever: Secondary | ICD-10-CM | POA: Diagnosis not present

## 2022-08-12 DIAGNOSIS — S60562A Insect bite (nonvenomous) of left hand, initial encounter: Secondary | ICD-10-CM | POA: Diagnosis not present

## 2022-08-17 DIAGNOSIS — E119 Type 2 diabetes mellitus without complications: Secondary | ICD-10-CM | POA: Diagnosis not present

## 2022-09-02 DIAGNOSIS — J9611 Chronic respiratory failure with hypoxia: Secondary | ICD-10-CM | POA: Diagnosis not present

## 2022-09-05 DIAGNOSIS — M48062 Spinal stenosis, lumbar region with neurogenic claudication: Secondary | ICD-10-CM | POA: Diagnosis not present

## 2022-09-05 DIAGNOSIS — M5416 Radiculopathy, lumbar region: Secondary | ICD-10-CM | POA: Diagnosis not present

## 2022-09-15 DIAGNOSIS — M5416 Radiculopathy, lumbar region: Secondary | ICD-10-CM | POA: Diagnosis not present

## 2022-09-17 DIAGNOSIS — E119 Type 2 diabetes mellitus without complications: Secondary | ICD-10-CM | POA: Diagnosis not present

## 2022-10-03 DIAGNOSIS — J9611 Chronic respiratory failure with hypoxia: Secondary | ICD-10-CM | POA: Diagnosis not present

## 2022-10-05 DIAGNOSIS — L209 Atopic dermatitis, unspecified: Secondary | ICD-10-CM | POA: Diagnosis not present

## 2022-10-10 DIAGNOSIS — M5416 Radiculopathy, lumbar region: Secondary | ICD-10-CM | POA: Diagnosis not present

## 2022-10-18 DIAGNOSIS — Z122 Encounter for screening for malignant neoplasm of respiratory organs: Secondary | ICD-10-CM | POA: Diagnosis not present

## 2022-10-18 DIAGNOSIS — E119 Type 2 diabetes mellitus without complications: Secondary | ICD-10-CM | POA: Diagnosis not present

## 2022-10-18 DIAGNOSIS — Z87891 Personal history of nicotine dependence: Secondary | ICD-10-CM | POA: Diagnosis not present

## 2022-10-18 DIAGNOSIS — F17210 Nicotine dependence, cigarettes, uncomplicated: Secondary | ICD-10-CM | POA: Diagnosis not present

## 2022-10-19 DIAGNOSIS — E538 Deficiency of other specified B group vitamins: Secondary | ICD-10-CM | POA: Diagnosis not present

## 2022-10-19 DIAGNOSIS — E1142 Type 2 diabetes mellitus with diabetic polyneuropathy: Secondary | ICD-10-CM | POA: Diagnosis not present

## 2022-10-19 DIAGNOSIS — M1712 Unilateral primary osteoarthritis, left knee: Secondary | ICD-10-CM | POA: Diagnosis not present

## 2022-10-19 DIAGNOSIS — Z23 Encounter for immunization: Secondary | ICD-10-CM | POA: Diagnosis not present

## 2022-10-19 DIAGNOSIS — K2960 Other gastritis without bleeding: Secondary | ICD-10-CM | POA: Diagnosis not present

## 2022-10-19 DIAGNOSIS — M5137 Other intervertebral disc degeneration, lumbosacral region: Secondary | ICD-10-CM | POA: Diagnosis not present

## 2022-10-19 DIAGNOSIS — E785 Hyperlipidemia, unspecified: Secondary | ICD-10-CM | POA: Diagnosis not present

## 2022-10-19 DIAGNOSIS — Z0000 Encounter for general adult medical examination without abnormal findings: Secondary | ICD-10-CM | POA: Diagnosis not present

## 2022-10-19 DIAGNOSIS — G894 Chronic pain syndrome: Secondary | ICD-10-CM | POA: Diagnosis not present

## 2022-10-19 DIAGNOSIS — E1165 Type 2 diabetes mellitus with hyperglycemia: Secondary | ICD-10-CM | POA: Diagnosis not present

## 2022-11-03 DIAGNOSIS — J9611 Chronic respiratory failure with hypoxia: Secondary | ICD-10-CM | POA: Diagnosis not present

## 2022-11-14 DIAGNOSIS — H6691 Otitis media, unspecified, right ear: Secondary | ICD-10-CM | POA: Diagnosis not present

## 2022-11-14 DIAGNOSIS — R070 Pain in throat: Secondary | ICD-10-CM | POA: Diagnosis not present

## 2022-11-14 DIAGNOSIS — J449 Chronic obstructive pulmonary disease, unspecified: Secondary | ICD-10-CM | POA: Diagnosis not present

## 2022-11-14 DIAGNOSIS — R051 Acute cough: Secondary | ICD-10-CM | POA: Diagnosis not present

## 2022-11-14 DIAGNOSIS — J069 Acute upper respiratory infection, unspecified: Secondary | ICD-10-CM | POA: Diagnosis not present

## 2022-11-14 DIAGNOSIS — R0981 Nasal congestion: Secondary | ICD-10-CM | POA: Diagnosis not present

## 2022-11-14 DIAGNOSIS — R509 Fever, unspecified: Secondary | ICD-10-CM | POA: Diagnosis not present

## 2022-11-15 DIAGNOSIS — E538 Deficiency of other specified B group vitamins: Secondary | ICD-10-CM | POA: Diagnosis not present

## 2022-11-18 DIAGNOSIS — E119 Type 2 diabetes mellitus without complications: Secondary | ICD-10-CM | POA: Diagnosis not present

## 2022-11-25 DIAGNOSIS — G4733 Obstructive sleep apnea (adult) (pediatric): Secondary | ICD-10-CM | POA: Diagnosis not present

## 2022-11-25 DIAGNOSIS — J309 Allergic rhinitis, unspecified: Secondary | ICD-10-CM | POA: Diagnosis not present

## 2022-11-25 DIAGNOSIS — E669 Obesity, unspecified: Secondary | ICD-10-CM | POA: Diagnosis not present

## 2022-11-25 DIAGNOSIS — J449 Chronic obstructive pulmonary disease, unspecified: Secondary | ICD-10-CM | POA: Diagnosis not present

## 2022-11-25 DIAGNOSIS — F17210 Nicotine dependence, cigarettes, uncomplicated: Secondary | ICD-10-CM | POA: Diagnosis not present

## 2022-12-03 DIAGNOSIS — J9611 Chronic respiratory failure with hypoxia: Secondary | ICD-10-CM | POA: Diagnosis not present

## 2022-12-19 DIAGNOSIS — E119 Type 2 diabetes mellitus without complications: Secondary | ICD-10-CM | POA: Diagnosis not present

## 2022-12-27 DIAGNOSIS — M5416 Radiculopathy, lumbar region: Secondary | ICD-10-CM | POA: Diagnosis not present

## 2023-01-03 DIAGNOSIS — J9611 Chronic respiratory failure with hypoxia: Secondary | ICD-10-CM | POA: Diagnosis not present

## 2023-01-06 DIAGNOSIS — E538 Deficiency of other specified B group vitamins: Secondary | ICD-10-CM | POA: Diagnosis not present

## 2023-01-19 DIAGNOSIS — E119 Type 2 diabetes mellitus without complications: Secondary | ICD-10-CM | POA: Diagnosis not present

## 2023-01-20 DIAGNOSIS — R0981 Nasal congestion: Secondary | ICD-10-CM | POA: Diagnosis not present

## 2023-01-20 DIAGNOSIS — R051 Acute cough: Secondary | ICD-10-CM | POA: Diagnosis not present

## 2023-01-20 DIAGNOSIS — R509 Fever, unspecified: Secondary | ICD-10-CM | POA: Diagnosis not present

## 2023-01-20 DIAGNOSIS — R519 Headache, unspecified: Secondary | ICD-10-CM | POA: Diagnosis not present

## 2023-01-20 DIAGNOSIS — J22 Unspecified acute lower respiratory infection: Secondary | ICD-10-CM | POA: Diagnosis not present

## 2023-01-24 DIAGNOSIS — F1120 Opioid dependence, uncomplicated: Secondary | ICD-10-CM | POA: Diagnosis not present

## 2023-01-24 DIAGNOSIS — M5416 Radiculopathy, lumbar region: Secondary | ICD-10-CM | POA: Diagnosis not present

## 2023-02-02 DIAGNOSIS — J9611 Chronic respiratory failure with hypoxia: Secondary | ICD-10-CM | POA: Diagnosis not present

## 2023-02-20 DIAGNOSIS — E119 Type 2 diabetes mellitus without complications: Secondary | ICD-10-CM | POA: Diagnosis not present

## 2023-03-15 DIAGNOSIS — E119 Type 2 diabetes mellitus without complications: Secondary | ICD-10-CM | POA: Diagnosis not present

## 2023-03-30 DIAGNOSIS — M5416 Radiculopathy, lumbar region: Secondary | ICD-10-CM | POA: Diagnosis not present

## 2023-04-14 DIAGNOSIS — E119 Type 2 diabetes mellitus without complications: Secondary | ICD-10-CM | POA: Diagnosis not present

## 2023-04-15 DIAGNOSIS — E119 Type 2 diabetes mellitus without complications: Secondary | ICD-10-CM | POA: Diagnosis not present

## 2023-05-04 DIAGNOSIS — E1165 Type 2 diabetes mellitus with hyperglycemia: Secondary | ICD-10-CM | POA: Diagnosis not present

## 2023-05-04 DIAGNOSIS — Z79899 Other long term (current) drug therapy: Secondary | ICD-10-CM | POA: Diagnosis not present

## 2023-05-04 DIAGNOSIS — F17210 Nicotine dependence, cigarettes, uncomplicated: Secondary | ICD-10-CM | POA: Diagnosis not present

## 2023-05-04 DIAGNOSIS — G894 Chronic pain syndrome: Secondary | ICD-10-CM | POA: Diagnosis not present

## 2023-05-04 DIAGNOSIS — E538 Deficiency of other specified B group vitamins: Secondary | ICD-10-CM | POA: Diagnosis not present

## 2023-05-04 DIAGNOSIS — E785 Hyperlipidemia, unspecified: Secondary | ICD-10-CM | POA: Diagnosis not present

## 2023-05-04 DIAGNOSIS — E1142 Type 2 diabetes mellitus with diabetic polyneuropathy: Secondary | ICD-10-CM | POA: Diagnosis not present

## 2023-05-04 DIAGNOSIS — I672 Cerebral atherosclerosis: Secondary | ICD-10-CM | POA: Diagnosis not present

## 2023-05-04 DIAGNOSIS — I872 Venous insufficiency (chronic) (peripheral): Secondary | ICD-10-CM | POA: Diagnosis not present

## 2023-05-10 ENCOUNTER — Telehealth: Payer: Self-pay | Admitting: Pharmacist

## 2023-05-10 DIAGNOSIS — M5416 Radiculopathy, lumbar region: Secondary | ICD-10-CM | POA: Diagnosis not present

## 2023-05-10 DIAGNOSIS — M48062 Spinal stenosis, lumbar region with neurogenic claudication: Secondary | ICD-10-CM | POA: Diagnosis not present

## 2023-05-10 DIAGNOSIS — F1120 Opioid dependence, uncomplicated: Secondary | ICD-10-CM | POA: Diagnosis not present

## 2023-05-10 NOTE — Progress Notes (Signed)
 Attempted to contact patient for Medication access and  medication management. Left HIPAA compliant message for patient to return my call at their convenience.    Reynold Bowen, PharmD Clinical Pharmacist  Direct Dial: [phone]

## 2023-05-11 DIAGNOSIS — G4733 Obstructive sleep apnea (adult) (pediatric): Secondary | ICD-10-CM | POA: Diagnosis not present

## 2023-05-22 ENCOUNTER — Other Ambulatory Visit (HOSPITAL_COMMUNITY): Payer: Self-pay

## 2023-05-22 ENCOUNTER — Telehealth: Payer: Self-pay

## 2023-05-22 NOTE — Telephone Encounter (Signed)
 Pharmacy Patient Advocate Encounter  Insurance verification completed.   The patient is insured through [employer] test claim for Januvia. Currently a quantity of 30 is a 30 day supply and the co-pay is 0 . The current 30 day co-pay is, $0.  No PA needed at this time.  This test claim was processed through [facility]- copay amounts may vary at other pharmacies due to pharmacy/plan contracts, or as the patient moves through the different stages of their insurance plan.

## 2023-05-23 DIAGNOSIS — D519 Vitamin B12 deficiency anemia, unspecified: Secondary | ICD-10-CM | POA: Diagnosis not present

## 2023-05-24 ENCOUNTER — Telehealth: Payer: Self-pay | Admitting: Pharmacist

## 2023-05-24 NOTE — Progress Notes (Signed)
   05/24/2023  Patient ID: Nathan Franco, male   DOB: 02-04-1961, 63 y.o.   MRN: [id number]  Called patient to notify him that per Dr. Casper Harrison, he is to discontinue Januvia 100mg  1 tablet PO daily. Continue Ozempic 0.5 mg subcutaneously once weekly only.  Left voice mail for patient to return my call regarding a medication. Placed call to [facility] [phone] and discontinued medication at pharmacy as of 05/24/2023.

## 2023-05-25 ENCOUNTER — Other Ambulatory Visit: Payer: Self-pay | Admitting: Pharmacist

## 2023-05-25 DIAGNOSIS — N521 Erectile dysfunction due to diseases classified elsewhere: Secondary | ICD-10-CM | POA: Diagnosis not present

## 2023-05-25 DIAGNOSIS — N3941 Urge incontinence: Secondary | ICD-10-CM | POA: Diagnosis not present

## 2023-05-25 DIAGNOSIS — N401 Enlarged prostate with lower urinary tract symptoms: Secondary | ICD-10-CM | POA: Diagnosis not present

## 2023-05-25 NOTE — Progress Notes (Incomplete)
   05/25/2023  Patient ID: Nathan Franco, male   DOB: 1960-04-12, 63 y.o.   MRN: [id number]  Patient presented to office on 05/25/2023 for face to face medication reconciliation.  Medication bottles reviewed with patient. Counseled patient on using stool softener or Miralax with Ozempic as needed for relief of occasional medication related constipation.  Patient reports that newly started amitriptyline 25 mg PO HS is not achieving the desired effect and wants to alert PCP for recommendations. Message sent to PCP and recommendations for increase to amitriptyline 50 mg PO HS made. Patient advised later that day via telephone follow call. New prescription for fluticasone nasal spray initiated for patient.  I personally removed the Januvia 100 mg bottle from patients medications with permission from patient. Patient advised to remain on Ozempic 0.5 mg subcutaneously once weekly. Patient verbalized understanding.  Insurance coverage for Ozempic explained to patient. All questions answered.   Reynold Bowen, PharmD Clinical Pharmacist [place] Direct Dial: [phone]

## 2023-05-26 DIAGNOSIS — F17210 Nicotine dependence, cigarettes, uncomplicated: Secondary | ICD-10-CM | POA: Diagnosis not present

## 2023-05-26 DIAGNOSIS — E669 Obesity, unspecified: Secondary | ICD-10-CM | POA: Diagnosis not present

## 2023-05-26 DIAGNOSIS — J449 Chronic obstructive pulmonary disease, unspecified: Secondary | ICD-10-CM | POA: Diagnosis not present

## 2023-05-26 DIAGNOSIS — G4733 Obstructive sleep apnea (adult) (pediatric): Secondary | ICD-10-CM | POA: Diagnosis not present

## 2023-06-02 DIAGNOSIS — R0781 Pleurodynia: Secondary | ICD-10-CM | POA: Diagnosis not present

## 2023-06-02 DIAGNOSIS — J189 Pneumonia, unspecified organism: Secondary | ICD-10-CM | POA: Diagnosis not present

## 2023-06-14 DIAGNOSIS — E119 Type 2 diabetes mellitus without complications: Secondary | ICD-10-CM | POA: Diagnosis not present

## 2023-06-28 DIAGNOSIS — M5416 Radiculopathy, lumbar region: Secondary | ICD-10-CM | POA: Diagnosis not present

## 2023-06-28 DIAGNOSIS — F1120 Opioid dependence, uncomplicated: Secondary | ICD-10-CM | POA: Diagnosis not present

## 2023-07-07 DIAGNOSIS — N521 Erectile dysfunction due to diseases classified elsewhere: Secondary | ICD-10-CM | POA: Diagnosis not present

## 2023-07-07 DIAGNOSIS — N3941 Urge incontinence: Secondary | ICD-10-CM | POA: Diagnosis not present

## 2023-07-07 DIAGNOSIS — N401 Enlarged prostate with lower urinary tract symptoms: Secondary | ICD-10-CM | POA: Diagnosis not present

## 2023-07-07 DIAGNOSIS — Z125 Encounter for screening for malignant neoplasm of prostate: Secondary | ICD-10-CM | POA: Diagnosis not present

## 2023-07-12 DIAGNOSIS — D519 Vitamin B12 deficiency anemia, unspecified: Secondary | ICD-10-CM | POA: Diagnosis not present

## 2023-07-12 DIAGNOSIS — A932 Colorado tick fever: Secondary | ICD-10-CM | POA: Diagnosis not present

## 2023-07-17 DIAGNOSIS — M5416 Radiculopathy, lumbar region: Secondary | ICD-10-CM | POA: Diagnosis not present

## 2023-08-03 DIAGNOSIS — M546 Pain in thoracic spine: Secondary | ICD-10-CM | POA: Diagnosis not present

## 2023-08-03 DIAGNOSIS — K5900 Constipation, unspecified: Secondary | ICD-10-CM | POA: Diagnosis not present

## 2023-08-03 DIAGNOSIS — R053 Chronic cough: Secondary | ICD-10-CM | POA: Diagnosis not present

## 2023-08-03 DIAGNOSIS — L739 Follicular disorder, unspecified: Secondary | ICD-10-CM | POA: Diagnosis not present

## 2023-09-09 DIAGNOSIS — L219 Seborrheic dermatitis, unspecified: Secondary | ICD-10-CM | POA: Diagnosis not present

## 2023-09-09 DIAGNOSIS — L209 Atopic dermatitis, unspecified: Secondary | ICD-10-CM | POA: Diagnosis not present

## 2023-09-14 DIAGNOSIS — F1120 Opioid dependence, uncomplicated: Secondary | ICD-10-CM | POA: Diagnosis not present

## 2023-09-14 DIAGNOSIS — M48062 Spinal stenosis, lumbar region with neurogenic claudication: Secondary | ICD-10-CM | POA: Diagnosis not present

## 2023-09-14 DIAGNOSIS — M5416 Radiculopathy, lumbar region: Secondary | ICD-10-CM | POA: Diagnosis not present

## 2023-10-19 DIAGNOSIS — M5416 Radiculopathy, lumbar region: Secondary | ICD-10-CM | POA: Diagnosis not present

## 2023-10-20 DIAGNOSIS — E1169 Type 2 diabetes mellitus with other specified complication: Secondary | ICD-10-CM | POA: Diagnosis not present

## 2023-10-20 DIAGNOSIS — N521 Erectile dysfunction due to diseases classified elsewhere: Secondary | ICD-10-CM | POA: Diagnosis not present

## 2023-10-20 DIAGNOSIS — N401 Enlarged prostate with lower urinary tract symptoms: Secondary | ICD-10-CM | POA: Diagnosis not present

## 2023-10-20 DIAGNOSIS — N3941 Urge incontinence: Secondary | ICD-10-CM | POA: Diagnosis not present

## 2023-10-25 DIAGNOSIS — Z122 Encounter for screening for malignant neoplasm of respiratory organs: Secondary | ICD-10-CM | POA: Diagnosis not present

## 2023-10-25 DIAGNOSIS — Z87891 Personal history of nicotine dependence: Secondary | ICD-10-CM | POA: Diagnosis not present

## 2023-10-26 DIAGNOSIS — H25813 Combined forms of age-related cataract, bilateral: Secondary | ICD-10-CM | POA: Diagnosis not present

## 2023-10-26 DIAGNOSIS — H524 Presbyopia: Secondary | ICD-10-CM | POA: Diagnosis not present

## 2023-10-26 DIAGNOSIS — E119 Type 2 diabetes mellitus without complications: Secondary | ICD-10-CM | POA: Diagnosis not present

## 2023-10-26 DIAGNOSIS — H40012 Open angle with borderline findings, low risk, left eye: Secondary | ICD-10-CM | POA: Diagnosis not present

## 2023-11-17 DIAGNOSIS — G4733 Obstructive sleep apnea (adult) (pediatric): Secondary | ICD-10-CM | POA: Diagnosis not present

## 2023-11-17 DIAGNOSIS — J449 Chronic obstructive pulmonary disease, unspecified: Secondary | ICD-10-CM | POA: Diagnosis not present

## 2023-11-17 DIAGNOSIS — J309 Allergic rhinitis, unspecified: Secondary | ICD-10-CM | POA: Diagnosis not present

## 2023-11-17 DIAGNOSIS — Z6841 Body Mass Index (BMI) 40.0 and over, adult: Secondary | ICD-10-CM | POA: Diagnosis not present

## 2023-11-17 DIAGNOSIS — J441 Chronic obstructive pulmonary disease with (acute) exacerbation: Secondary | ICD-10-CM | POA: Diagnosis not present

## 2023-11-17 DIAGNOSIS — F17210 Nicotine dependence, cigarettes, uncomplicated: Secondary | ICD-10-CM | POA: Diagnosis not present

## 2023-11-22 DIAGNOSIS — Z720 Tobacco use: Secondary | ICD-10-CM | POA: Diagnosis not present

## 2023-11-22 DIAGNOSIS — Z8679 Personal history of other diseases of the circulatory system: Secondary | ICD-10-CM | POA: Diagnosis not present

## 2023-11-22 DIAGNOSIS — R079 Chest pain, unspecified: Secondary | ICD-10-CM | POA: Diagnosis not present

## 2023-11-22 DIAGNOSIS — E782 Mixed hyperlipidemia: Secondary | ICD-10-CM | POA: Diagnosis not present

## 2023-11-27 DIAGNOSIS — E785 Hyperlipidemia, unspecified: Secondary | ICD-10-CM | POA: Diagnosis not present

## 2023-11-27 DIAGNOSIS — I2510 Atherosclerotic heart disease of native coronary artery without angina pectoris: Secondary | ICD-10-CM | POA: Diagnosis not present

## 2023-12-04 DIAGNOSIS — F1120 Opioid dependence, uncomplicated: Secondary | ICD-10-CM | POA: Diagnosis not present

## 2023-12-04 DIAGNOSIS — M5416 Radiculopathy, lumbar region: Secondary | ICD-10-CM | POA: Diagnosis not present

## 2023-12-07 DIAGNOSIS — F1120 Opioid dependence, uncomplicated: Secondary | ICD-10-CM | POA: Diagnosis not present

## 2023-12-07 DIAGNOSIS — E1151 Type 2 diabetes mellitus with diabetic peripheral angiopathy without gangrene: Secondary | ICD-10-CM | POA: Diagnosis not present

## 2023-12-07 DIAGNOSIS — J4489 Other specified chronic obstructive pulmonary disease: Secondary | ICD-10-CM | POA: Diagnosis not present

## 2023-12-07 DIAGNOSIS — I509 Heart failure, unspecified: Secondary | ICD-10-CM | POA: Diagnosis not present

## 2023-12-07 DIAGNOSIS — E11620 Type 2 diabetes mellitus with diabetic dermatitis: Secondary | ICD-10-CM | POA: Diagnosis not present

## 2023-12-07 DIAGNOSIS — E1136 Type 2 diabetes mellitus with diabetic cataract: Secondary | ICD-10-CM | POA: Diagnosis not present

## 2023-12-07 DIAGNOSIS — I110 Hypertensive heart disease with heart failure: Secondary | ICD-10-CM | POA: Diagnosis not present

## 2023-12-07 DIAGNOSIS — J9610 Chronic respiratory failure, unspecified whether with hypoxia or hypercapnia: Secondary | ICD-10-CM | POA: Diagnosis not present

## 2023-12-19 DIAGNOSIS — E1165 Type 2 diabetes mellitus with hyperglycemia: Secondary | ICD-10-CM | POA: Diagnosis not present

## 2023-12-19 DIAGNOSIS — Z9181 History of falling: Secondary | ICD-10-CM | POA: Diagnosis not present

## 2023-12-19 DIAGNOSIS — B370 Candidal stomatitis: Secondary | ICD-10-CM | POA: Diagnosis not present

## 2023-12-19 DIAGNOSIS — B3781 Candidal esophagitis: Secondary | ICD-10-CM | POA: Diagnosis not present

## 2023-12-19 DIAGNOSIS — E1142 Type 2 diabetes mellitus with diabetic polyneuropathy: Secondary | ICD-10-CM | POA: Diagnosis not present

## 2023-12-19 DIAGNOSIS — J9611 Chronic respiratory failure with hypoxia: Secondary | ICD-10-CM | POA: Diagnosis not present

## 2023-12-19 DIAGNOSIS — Z0000 Encounter for general adult medical examination without abnormal findings: Secondary | ICD-10-CM | POA: Diagnosis not present

## 2023-12-19 DIAGNOSIS — Z1339 Encounter for screening examination for other mental health and behavioral disorders: Secondary | ICD-10-CM | POA: Diagnosis not present

## 2024-01-22 DIAGNOSIS — M5416 Radiculopathy, lumbar region: Secondary | ICD-10-CM | POA: Diagnosis not present
